# Patient Record
Sex: Male | Born: 1950 | Race: White | Hispanic: No | Marital: Single | Smoking: Never smoker
Health system: Southern US, Community
[De-identification: ages and names within clinical notes are randomized; demographics above are authoritative.]

## PROBLEM LIST (undated history)

## (undated) DIAGNOSIS — I10 Essential (primary) hypertension: Secondary | ICD-10-CM

## (undated) DIAGNOSIS — N400 Enlarged prostate without lower urinary tract symptoms: Secondary | ICD-10-CM

## (undated) DIAGNOSIS — K219 Gastro-esophageal reflux disease without esophagitis: Secondary | ICD-10-CM

## (undated) HISTORY — DX: Benign prostatic hyperplasia without lower urinary tract symptoms: N40.0

## (undated) HISTORY — DX: Gastro-esophageal reflux disease without esophagitis: K21.9

## (undated) HISTORY — PX: TONSILLECTOMY: SUR1361

## (undated) HISTORY — PX: COLONOSCOPY: SHX174

---

## 2009-04-28 ENCOUNTER — Emergency Department: Payer: Self-pay | Admitting: Emergency Medicine

## 2009-07-29 ENCOUNTER — Emergency Department: Payer: Self-pay | Admitting: Emergency Medicine

## 2011-02-19 ENCOUNTER — Emergency Department: Payer: Self-pay | Admitting: Emergency Medicine

## 2012-08-22 DIAGNOSIS — L729 Follicular cyst of the skin and subcutaneous tissue, unspecified: Secondary | ICD-10-CM | POA: Insufficient documentation

## 2013-08-08 ENCOUNTER — Encounter: Payer: Self-pay | Admitting: General Surgery

## 2013-08-21 ENCOUNTER — Encounter: Payer: Self-pay | Admitting: General Surgery

## 2013-08-21 ENCOUNTER — Ambulatory Visit (INDEPENDENT_AMBULATORY_CARE_PROVIDER_SITE_OTHER): Payer: No Typology Code available for payment source | Admitting: General Surgery

## 2013-08-21 VITALS — BP 150/90 | HR 80 | Resp 12 | Ht 67.0 in | Wt 164.0 lb

## 2013-08-21 DIAGNOSIS — L723 Sebaceous cyst: Secondary | ICD-10-CM

## 2013-08-21 DIAGNOSIS — L729 Follicular cyst of the skin and subcutaneous tissue, unspecified: Secondary | ICD-10-CM

## 2013-08-21 NOTE — Patient Instructions (Signed)
Patient to return to have this cyst removed.

## 2013-08-21 NOTE — Progress Notes (Signed)
Patient ID: Aaron [name], male   DOB: 02/14/1953, 62 y.o.   MRN: [id number]  Chief Complaint  Patient presents with  . Other    cyst on neck    HPI Aaron [name] is a 62 y.o. male here today for an evaluation of cyst on neck . Patient states been there for about 1 year. He states it is getting bigger. Sore to touch. He reports that his collar rubs on the area and has been irritating to The patient denies any trauma to the area. No purulent drainage. HPI  Past Medical History  Diagnosis Date  . Enlarged prostate   . GERD (gastroesophageal reflux disease)     Past Surgical History  Procedure Laterality Date  . Tonsillectomy      age 47    No family history on file.  Social History History  Substance Use Topics  . Smoking status: Never Smoker   . Smokeless tobacco: Never Used  . Alcohol Use: No    No Known Allergies  Current Outpatient Prescriptions  Medication Sig Dispense Refill  . aspirin 81 MG tablet Take 81 mg by mouth daily.      Marland Kitchen omeprazole (PRILOSEC) 20 MG capsule Take 20 mg by mouth daily.      . tamsulosin (FLOMAX) 0.4 MG CAPS capsule Take 0.4 mg by mouth daily.       No current facility-administered medications for this visit.    Review of Systems Review of Systems  Constitutional: Negative.   Respiratory: Negative.   Cardiovascular: Negative.     Blood pressure 150/90, pulse 80, resp. rate 12, height 5\' 7"  (1.702 m), weight 164 lb (74.39 kg).  Physical Exam Physical Exam  Constitutional: He is oriented to person, place, and time. He appears well-developed and well-nourished.  Cardiovascular: Normal rate, regular rhythm and normal heart sounds.   Pulmonary/Chest: Breath sounds normal.  Musculoskeletal:  2 by 3 cm tender mass on the back of his head.  Lymphadenopathy:    He has no cervical adenopathy.  Neurological: He is alert and oriented to person, place, and time.  Skin: Skin is warm and dry.    Data Reviewed None.  Assessment     Enlarging sebaceous cyst of the right posterior neck.    Plan    Excision will be schedule convenient time. He was working long force but with the [employer] he has been encouraged to take at least 2 days off afterward to minimize discomfort.       [name] 08/22/2013, 9:42 AM

## 2013-08-22 ENCOUNTER — Encounter: Payer: Self-pay | Admitting: General Surgery

## 2013-09-06 ENCOUNTER — Ambulatory Visit (INDEPENDENT_AMBULATORY_CARE_PROVIDER_SITE_OTHER): Payer: No Typology Code available for payment source | Admitting: General Surgery

## 2013-09-06 ENCOUNTER — Encounter: Payer: Self-pay | Admitting: General Surgery

## 2013-09-06 VITALS — BP 118/80 | HR 84 | Resp 16 | Ht 67.0 in | Wt 184.0 lb

## 2013-09-06 DIAGNOSIS — L723 Sebaceous cyst: Secondary | ICD-10-CM

## 2013-09-06 DIAGNOSIS — L729 Follicular cyst of the skin and subcutaneous tissue, unspecified: Secondary | ICD-10-CM

## 2013-09-06 NOTE — Patient Instructions (Signed)
Patient to return in 1 week for follow up nurse visit.

## 2013-09-06 NOTE — Progress Notes (Signed)
Patient ID: Aaron [name], male   DOB: Apr 10, 1951, 62 y.o.   MRN: [id number]  Chief Complaint  Patient presents with  . Procedure    excision cyst on back of head    HPI Aaron [name] is a 62 y.o. male here today for an excision cyst on back of head.  HPI  Past Medical History  Diagnosis Date  . Enlarged prostate   . GERD (gastroesophageal reflux disease)     Past Surgical History  Procedure Laterality Date  . Tonsillectomy      age 66    No family history on file.  Social History History  Substance Use Topics  . Smoking status: Never Smoker   . Smokeless tobacco: Never Used  . Alcohol Use: No    No Known Allergies  Current Outpatient Prescriptions  Medication Sig Dispense Refill  . aspirin 81 MG tablet Take 81 mg by mouth daily.      Marland Kitchen omeprazole (PRILOSEC) 20 MG capsule Take 20 mg by mouth daily.      . tamsulosin (FLOMAX) 0.4 MG CAPS capsule Take 0.4 mg by mouth daily.       No current facility-administered medications for this visit.    Review of Systems Review of Systems  Constitutional: Negative.   Respiratory: Negative.   Cardiovascular: Negative.     Blood pressure 118/80, pulse 84, resp. rate 16, height 5\' 7"  (1.702 m), weight 184 lb (83.462 kg).  Physical Exam Physical Exam Examination shows a sebaceous cyst in the base the neck just to the right of the midline. This is about 2.5 cm in diameter. Marked decrease in swelling from his last exam.  Assessment    Sebaceous cyst, recently infected.    Plan    We elected to proceed to excision. The area was prepped with alcohol and 20 cc of 0.5% Xylocaine with 0.25% Marcaine with 1 200,000 of epinephrine was utilized well tolerated. ChloraPrep was applied to the skin. Through elliptical incision the cystic area was excised. As this was with 3-0 Vicryl suture ligatures. The deep tissue was approximated with interrupted 3-0 Vicryl sutures. The skin was closed with a running 4-0 nylon suture. Telfa and  Tegaderm dressing applied. The procedure was well tolerated. The patient will follow up in one week for suture removal but the nurse.       Aaron [name] 09/07/2013, 5:14 PM

## 2013-09-07 LAB — PATHOLOGY

## 2013-09-15 ENCOUNTER — Ambulatory Visit (INDEPENDENT_AMBULATORY_CARE_PROVIDER_SITE_OTHER): Payer: Self-pay | Admitting: *Deleted

## 2013-09-15 DIAGNOSIS — L723 Sebaceous cyst: Secondary | ICD-10-CM

## 2013-09-15 DIAGNOSIS — L729 Follicular cyst of the skin and subcutaneous tissue, unspecified: Secondary | ICD-10-CM

## 2013-09-15 NOTE — Progress Notes (Signed)
Patient came in today for a wound check. Sutures removed. The wound is clean, with no signs of infection noted. Follow up as needed.

## 2013-11-18 ENCOUNTER — Emergency Department: Payer: Self-pay | Admitting: Emergency Medicine

## 2013-11-18 LAB — RAPID INFLUENZA A&B ANTIGENS

## 2013-11-18 IMAGING — CR DG CHEST 2V
1 series · 2 of 2 positions shown · non-contrast
Comparison: None.

CLINICAL DATA: Cough, fever, wheezing.

EXAM:
CHEST  2 VIEW

[Series 1: w chest pa · 0.14mm/px · 2 of 2 slices shown]
[im 1/2]
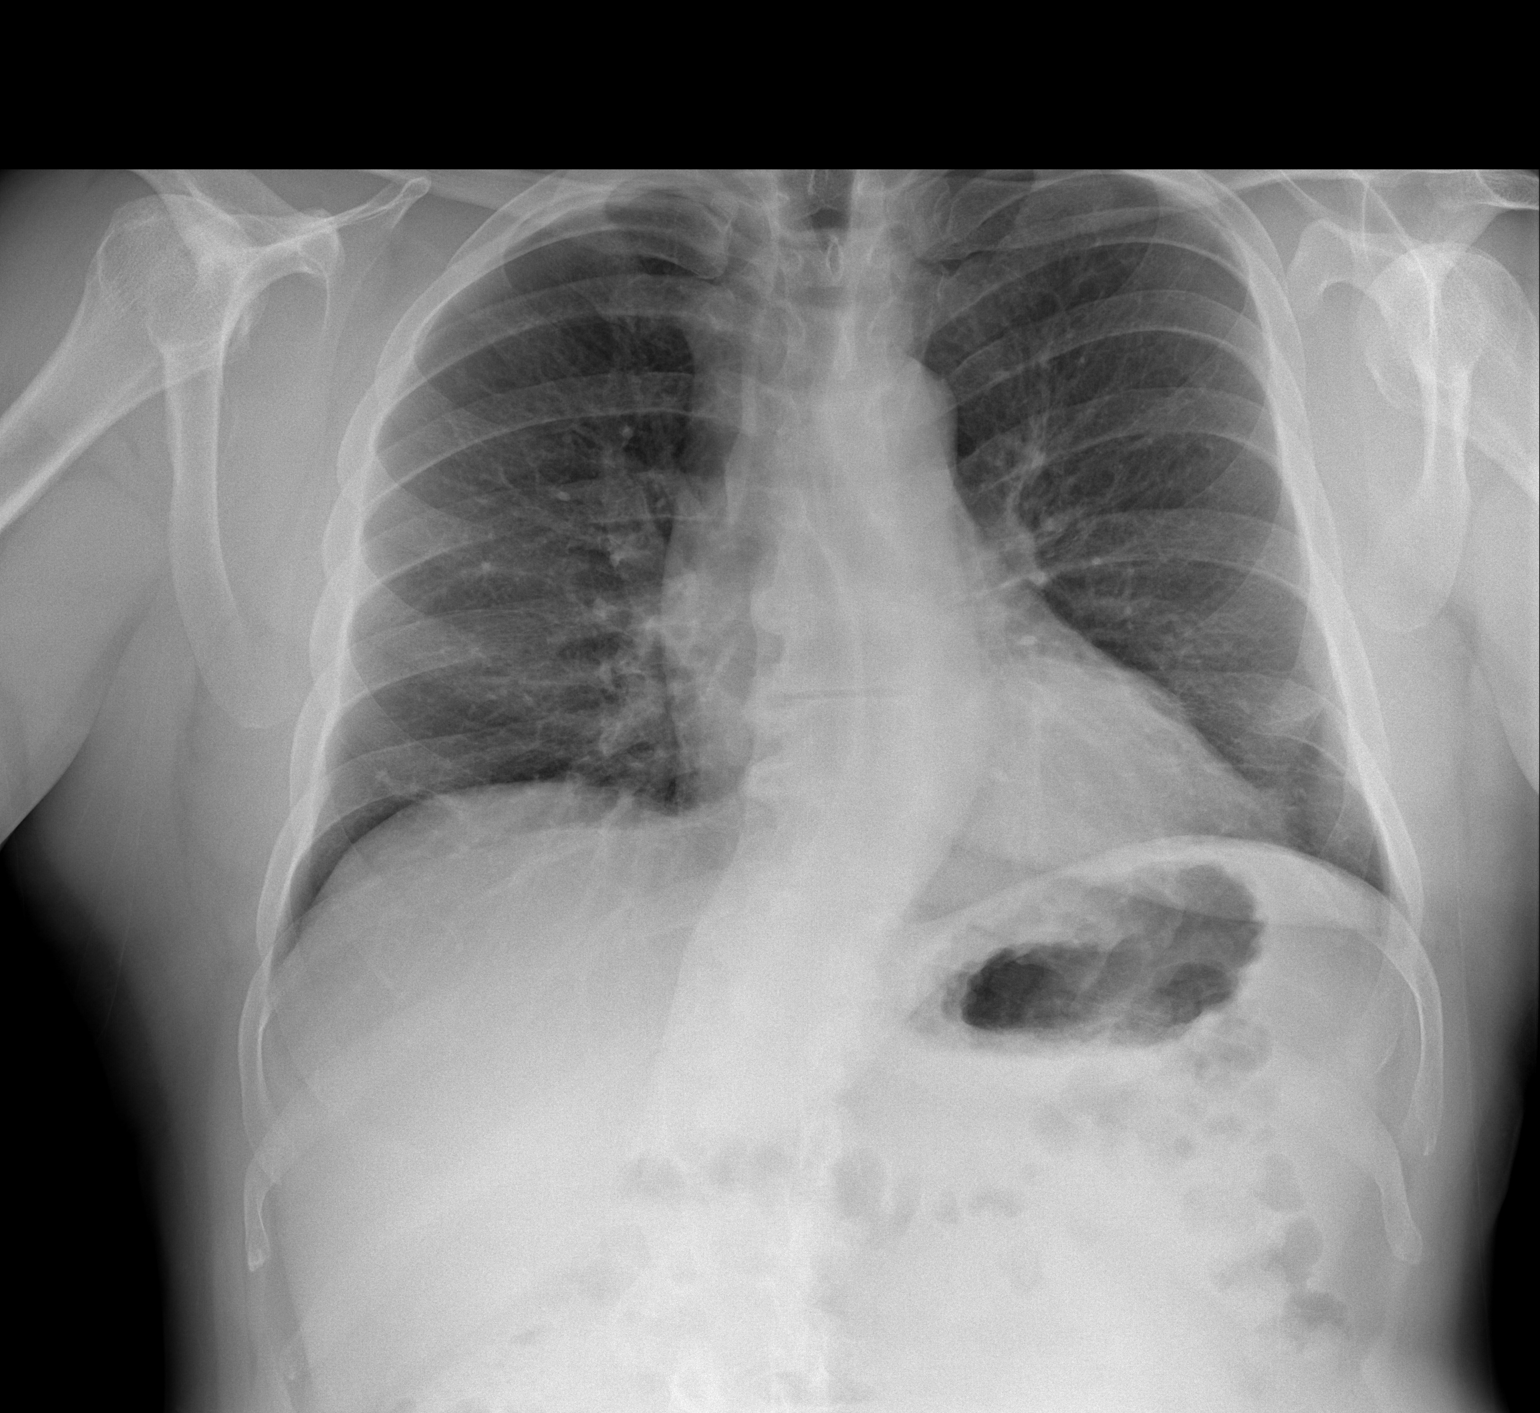
[im 2/2]
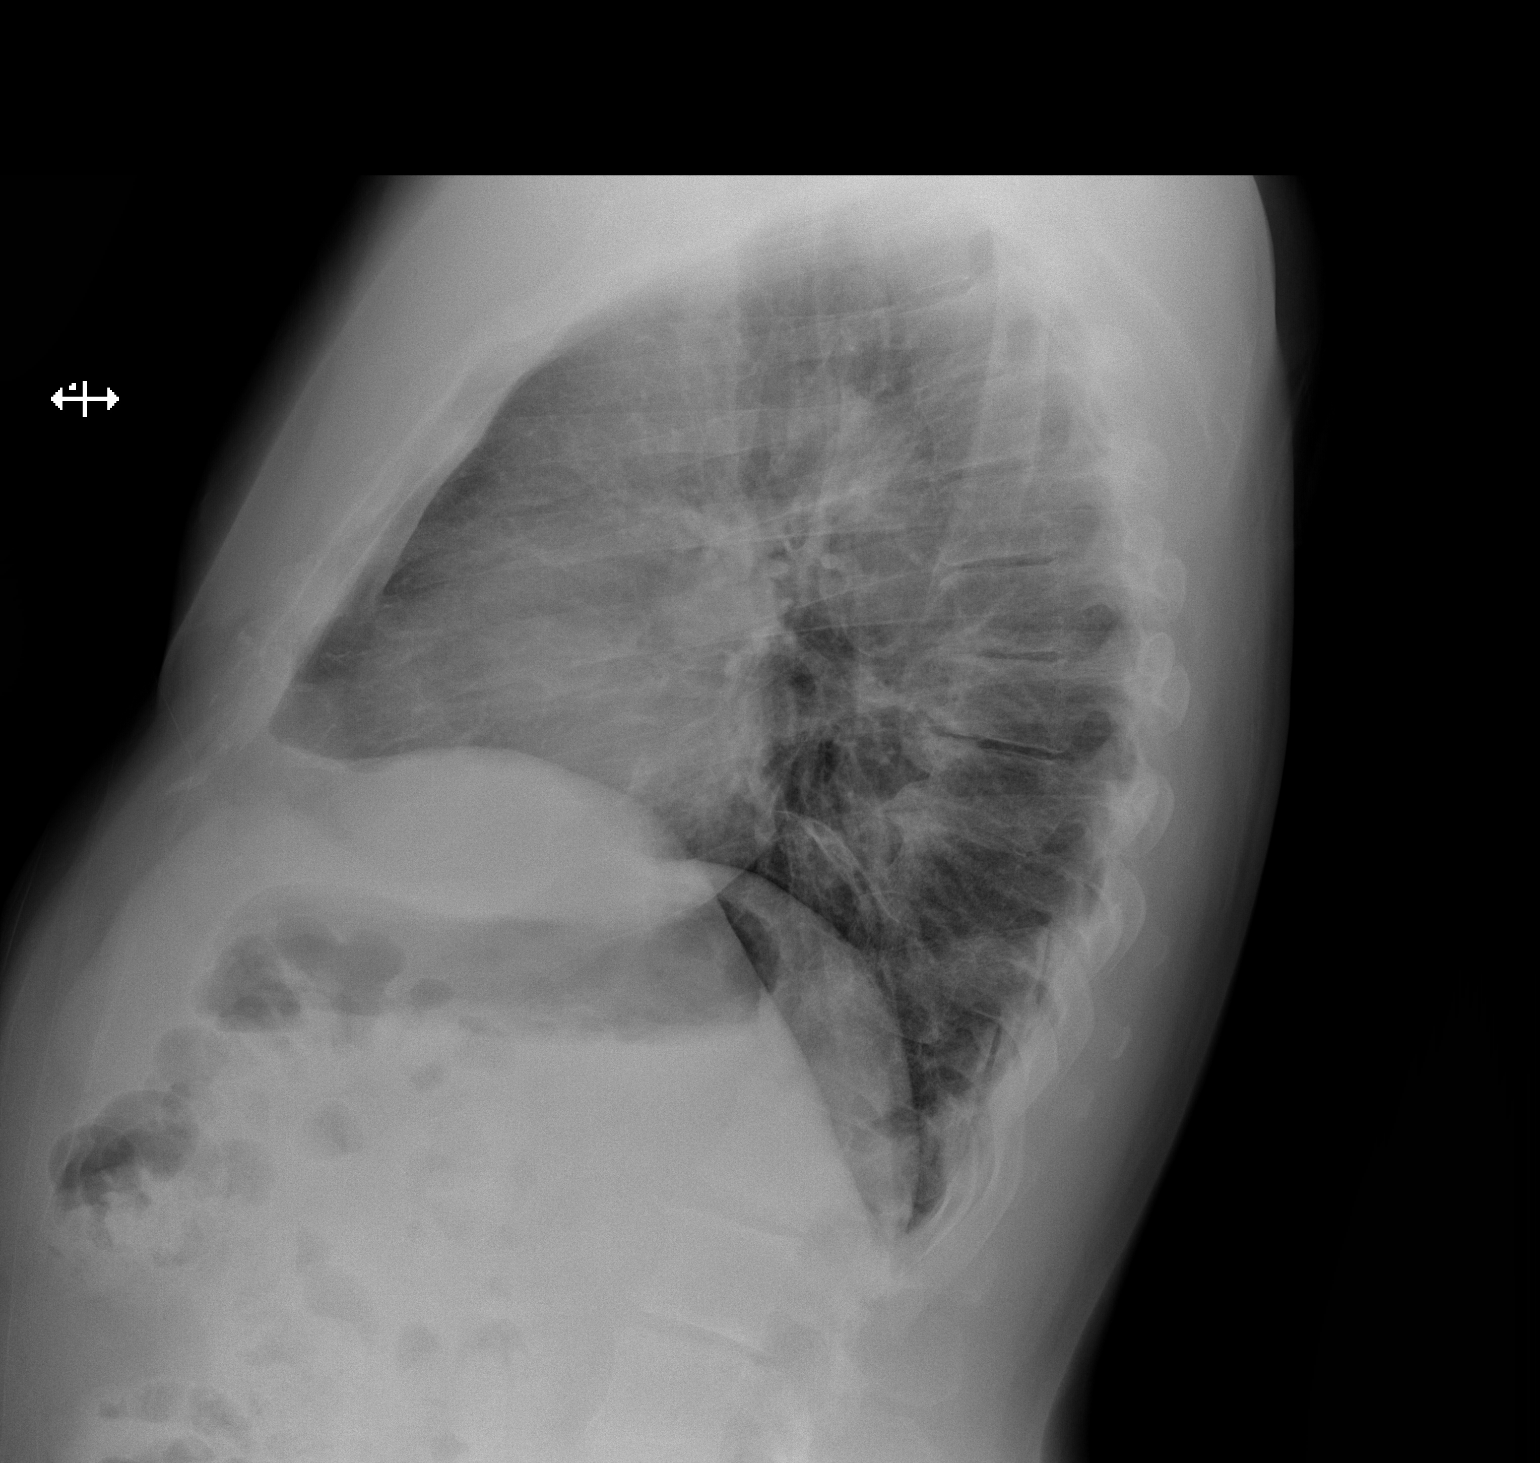

[2 of 2 positions shown; findings below may reference images not displayed]

FINDINGS: Heart is upper limits normal in size. Tortuosity of the thoracic
aorta. No confluent opacities or effusions. Degenerative changes in
the thoracic spine.
IMPRESSION: No acute findings.

## 2014-07-25 ENCOUNTER — Ambulatory Visit: Payer: Self-pay | Admitting: Urology

## 2014-07-31 ENCOUNTER — Ambulatory Visit: Payer: Self-pay | Admitting: Urology

## 2015-03-23 NOTE — H&P (Signed)
PATIENT NAME:  Aaron [name], Aaron [name] MR#:  [id number] DATE OF BIRTH:  October 29, 1951  DATE OF ADMISSION:  07/31/2014  The patient is to have same-day surgery on 07/31/2014.  CHIEF COMPLAINT: Difficulty voiding and elevated PSA.   HISTORY OF PRESENT ILLNESS: Mr. Aaron [name] is [name] 64 year old white male with [name] long history of BPH and lower urinary tract symptoms. He was found to have an elevated PSA of 10.3 and was evaluated with ultrasound-guided biopsy. Ultrasound indicated [name] 97.0 g prostate and pathology was benign on 07/06/2014. He also underwent [name] Uroflow, which was consistent with significant obstruction and [name] residual of 288 mL. The patient was recently diagnosed with hypogonadism, but is not [name] candidate for testosterone replacement therapy due to the massive BPH. He comes in now for photovaporization of the prostate with [name] Green Light laser.   ALLERGIES: NO DRUG ALLERGIES.   CURRENT MEDICATIONS: Prilosec OTC, Flomax, aspirin, protein supplements, amino acid supplements, vitamin E, vitamin B complex, and vitamin B6.   PAST SURGICAL HISTORY: Tonsillectomy in 1957.   SOCIAL HISTORY: The patient denied tobacco use. Consumes 1-4 alcoholic beverages per week.   FAMILY HISTORY: The patient's father died of [name] myocardial infarction at age 17. There is no history of prostate cancer.   PAST AND CURRENT MEDICAL HISTORY:  1.  GERD.  2.  BPH.   REVIEW OF SYSTEMS: The patient denied weight change, fatigue, fever, chills, night sweats, rash, sleep disturbance, double vision, eye pain, loss of vision, difficulty swallowing, chest pain, swelling of the ankles and legs, leg pain, shortness of breath, cough, hematemesis, nausea, vomiting, diarrhea, constipation, hearing loss, stomach pain, hematuria, frequent urinary tract infections, painful urination, nosebleeds, blood in the stool, easy bruising, unwanted growth of hair, hair loss, dizziness, frequent falling and numbness in the arms or legs.   PHYSICAL EXAMINATION:   GENERAL: Well-nourished white male, in no acute distress.  HEENT: Sclerae were clear. Pupils were equally round, reactive to light and accommodation. Extraocular movements were intact.  NECK: Supple. No palpable cervical adenopathy. No audible carotid bruits.  LUNGS: Clear to auscultation.  CARDIOVASCULAR: Regular rhythm and rate, without audible murmurs or gallops.  ABDOMEN: Soft, nontender abdomen.  GENITOURINARY: Circumcised testes, smooth and nontender, 20 mL in size each.  RECTAL: Greater than 50 g, slightly nodular prostate.  NEUROMUSCULAR: Alert, oriented x 3.   IMPRESSION: Massive benign prostatic hypertrophy with bladder outlet obstruction.   PLAN: Photovaporization of the prostate with the Green Light laser.    ____________________________ Otelia Limes. Yves Dill, MD [id number] D: 07/25/2014 12:39:00 ET T: 07/25/2014 13:37:43 ET JOB#: [id number]  cc: Otelia Limes. Yves Dill, MD, <Dictator> Royston Cowper MD ELECTRONICALLY SIGNED 07/25/2014 17:04

## 2015-03-23 NOTE — Op Note (Signed)
PATIENT NAME:  Aaron [name], Aaron [name] MR#:  [id number] DATE OF BIRTH:  1951/03/22  DATE OF PROCEDURE:  07/31/2014  PREOPERATIVE DIAGNOSIS: Benign prostatic hypertrophy with bladder outlet obstruction.   POSTOPERATIVE DIAGNOSIS: Benign prostatic hypertrophy with bladder outlet obstruction.   PROCEDURE:  Photovaporization of the prostate with [name] GreenLight laser.   SURGEON: Maryan Puls, M.D.   ANESTHETIST:  [employer].   ANESTHETIC METHOD: General.   INDICATIONS: See the dictated history and physical. After informed consent, the patient requests the above procedure.   OPERATIVE SUMMARY: After adequate general anesthesia had been obtained, the patient was placed into dorsal lithotomy position and the perineum was prepped and draped in the usual fashion. The laser scope was coupled with [name] camera and then visually advanced into the bladder. The bladder was thoroughly inspected. Both ureteral orifices were identified and had clear efflux. The bladder was moderately trabeculated. No bladder tumors were identified. The patient had lateral lobe prostatic hypertrophy with [name] prostatic urethral length of 6 cm. At this point, the GreenLight XPS laser fiber was introduced through the scope and set at 80 watts. The bladder neck tissue was vaporized. Next, the power was increased up to 120 watts and obstructive tissue from the bladder neck to the verumontanum vaporized. Finally, the power was increased up to 180 watts and remaining obstructive tissue was vaporized. At this point, the scope was removed. Then, 10 mL of viscous Xylocaine was instilled within the urethra. [name] 02-MVVKPQ silicone catheter was placed. The catheter was irrigated until clear. [name] B and O suppository was placed. The procedure was then terminated and the patient was transferred to the recovery room in stable condition.    ____________________________ Otelia Limes. Yves Dill, MD [id number] D: 07/31/2014 17:13:27 ET T: 07/31/2014 17:44:12  ET JOB#: [id number]  cc: Otelia Limes. Yves Dill, MD, <Dictator> Royston Cowper MD ELECTRONICALLY SIGNED 08/01/2014 11:44

## 2015-09-02 ENCOUNTER — Ambulatory Visit: Payer: Self-pay | Admitting: Family

## 2015-09-02 ENCOUNTER — Encounter: Payer: Self-pay | Admitting: Physician Assistant

## 2015-09-02 VITALS — BP 155/99 | HR 96 | Temp 98.0°F

## 2015-09-02 DIAGNOSIS — R7303 Prediabetes: Secondary | ICD-10-CM | POA: Insufficient documentation

## 2015-09-02 DIAGNOSIS — I10 Essential (primary) hypertension: Secondary | ICD-10-CM | POA: Insufficient documentation

## 2015-09-02 MED ORDER — LISINOPRIL 10 MG PO TABS: 10.0000 mg | ORAL_TABLET | Freq: Every day | ORAL | Status: DC

## 2015-09-02 NOTE — Progress Notes (Signed)
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 Lifestyle changes reviewed . F/u 3 weeks.

## 2015-09-18 ENCOUNTER — Encounter: Payer: Self-pay | Admitting: Physician Assistant

## 2015-09-18 ENCOUNTER — Ambulatory Visit: Payer: Self-pay | Admitting: Physician Assistant

## 2015-09-18 VITALS — BP 120/89 | HR 92 | Temp 97.7°F

## 2015-09-18 DIAGNOSIS — S76311A Strain of muscle, fascia and tendon of the posterior muscle group at thigh level, right thigh, initial encounter: Secondary | ICD-10-CM

## 2015-09-18 DIAGNOSIS — I10 Essential (primary) hypertension: Secondary | ICD-10-CM

## 2015-09-18 NOTE — Patient Instructions (Addendum)
[place] RECOMMENDATIONS  NAME:Aaron [name] DEPARTMENT:  SCREEN REPORT: Based on this screening exam, there is or is not a detected condition which would place the examinee, fellow employees, or patients at increased risk of physical impairment from his/her work duties  DIAGNOSIS: torn hamstring  NO RECOMMENDATION/RESTSRICTIONS RECOMMENDATIONS/RESTSRICTIONS LISTED BELOW: no climbing steps for 1 weeks     SIGNATURE:___________________________________________ DATE:____10/19/2016____        Muscle Strain A muscle strain (pulled muscle) happens when a muscle is stretched beyond normal length. It happens when a sudden, violent force stretches your muscle too far. Usually, a few of the fibers in your muscle are torn. Muscle strain is common in athletes. Recovery usually takes 1-2 weeks. Complete healing takes 5-6 weeks.  HOME CARE   Follow the PRICE method of treatment to help your injury get better. Do this the first 2-3 days after the injury:  Protect. Protect the muscle to keep it from getting injured again.  Rest. Limit your activity and rest the injured body part.  Ice. Put ice in a plastic bag. Place a towel between your skin and the bag. Then, apply the ice and leave it on from 15-20 minutes each hour. After the third day, switch to moist heat packs.  Compression. Use a splint or elastic bandage on the injured area for comfort. Do not put it on too tightly.  Elevate. Keep the injured body part above the level of your heart.  Only take medicine as told by your doctor.  Warm up before doing exercise to prevent future muscle strains. GET HELP IF:   You have more pain or puffiness (swelling) in the injured area.  You feel numbness, tingling, or notice a loss of strength in the injured area. MAKE SURE YOU:   Understand these instructions.  Will watch your condition.  Will get help  right away if you are not doing well or get worse.   This information is not intended to replace advice given to you by your health care provider. Make sure you discuss any questions you have with your health care provider.   Document Released: 08/25/2008 Document Revised: 09/06/2013 Document Reviewed: 06/15/2013 Elsevier Interactive Patient Education 2016 Story for Routine Care of Injuries Many injuries can be cared for using rest, ice, compression, and elevation (RICE therapy). Using RICE therapy can help to lessen pain and swelling. It can help your body to heal. Rest Reduce your normal activities and avoid using the injured part of your body. You can go back to your normal activities when you feel okay and your doctor says it is okay. Ice Do not put ice on your bare skin.  Put ice in a plastic bag.  Place a towel between your skin and the bag.  Leave the ice on for 20 minutes, 2-3 times a day. Do this for as long as told by your doctor. Compression Compression means putting pressure on the injured area. This can be done with an elastic bandage. If an elastic bandage has been applied:  Remove and reapply the bandage every 3-4 hours or as told by your doctor.  Make sure the bandage is not wrapped too tight. Wrap the bandage more loosely if part of your body beyond the bandage is blue, swollen, cold, painful, or loses feeling (numb).  See your doctor if the bandage seems to make your problems worse. Elevation Elevation means keeping the injured area raised. Raise the  injured area above your heart or the center of your chest if you can. WHEN SHOULD I GET HELP? You should get help if:  You keep having pain and swelling.  Your symptoms get worse. WHEN SHOULD I GET HELP RIGHT AWAY? You should get help right away if:  You have sudden bad pain at or below the area of your injury.  You have redness or more swelling around your injury.  You have tingling or numbness  at or below the injury that does not go away when you take off the bandage.   This information is not intended to replace advice given to you by your health care provider. Make sure you discuss any questions you have with your health care provider.   Document Released: 05/04/2008 Document Revised: 08/07/2015 Document Reviewed: 10/24/2014 Elsevier Interactive Patient Education [employer].

## 2015-09-18 NOTE — Progress Notes (Signed)
S: has bruising and swelling behind knee, was doing karate kick and felt pop, leg is still a little swollen, increased pain after doing a lot of steps, using otc meds and ice/heat  O: vitals wnl, nad, posterior of r leg with swelling and bruising behind knee and to top of lower leg, area tender at ligament, full rom, n/v intact  A: partial muscle tear  P; otc meds, ice, work note given to not do steps at work for 1 week, return if worsening or call and will refer to sports med

## 2016-01-03 ENCOUNTER — Encounter: Payer: Self-pay | Admitting: Physician Assistant

## 2016-01-03 ENCOUNTER — Ambulatory Visit: Payer: Self-pay | Admitting: Physician Assistant

## 2016-01-03 VITALS — BP 120/80 | HR 94 | Temp 98.4°F

## 2016-01-03 DIAGNOSIS — J209 Acute bronchitis, unspecified: Secondary | ICD-10-CM

## 2016-01-03 MED ORDER — PSEUDOEPH-BROMPHEN-DM 30-2-10 MG/5ML PO SYRP: 5.0000 mL | ORAL_SOLUTION | Freq: Four times a day (QID) | ORAL | Status: DC | PRN

## 2016-01-03 MED ORDER — AZITHROMYCIN 250 MG PO TABS: ORAL_TABLET | ORAL | Status: DC

## 2016-01-03 NOTE — Progress Notes (Signed)
   Subjective:    Patient ID: Aaron [name], male    DOB: 1951/08/29, 65 y.o.   MRN: [id number]  HPI Patient c/o running nose, sore throat, and productive cough for 3 days. Denies fever/chill, or N/V/D. No palliative measures for these compliant.  Review of Systems     Negative except for compliant. Objective:   Physical Exam Vital sign stable, edematous nasal turbinates, post nasal drainage.  Neck supple without adenopathy. Lungs with bilateral Rales. Heart RRR.       Assessment & Plan:Bronchitis  Zithromax and Bromfed DM.  Follow up as needed.

## 2016-01-06 ENCOUNTER — Other Ambulatory Visit: Payer: Self-pay | Admitting: Emergency Medicine

## 2016-01-06 MED ORDER — ZOLPIDEM TARTRATE 10 MG PO TABS: 10.0000 mg | ORAL_TABLET | Freq: Every evening | ORAL | Status: DC | PRN

## 2016-01-06 NOTE — Telephone Encounter (Signed)
Patient came in to request a refill for his Ambien.

## 2016-01-08 ENCOUNTER — Ambulatory Visit: Payer: Self-pay | Admitting: Physician Assistant

## 2016-01-08 ENCOUNTER — Encounter: Payer: Self-pay | Admitting: Physician Assistant

## 2016-01-08 VITALS — BP 130/80 | HR 82 | Temp 97.7°F

## 2016-01-08 DIAGNOSIS — J069 Acute upper respiratory infection, unspecified: Secondary | ICD-10-CM

## 2016-01-08 MED ORDER — METHYLPREDNISOLONE 4 MG PO TBPK: ORAL_TABLET | ORAL | Status: DC

## 2016-01-08 MED ORDER — ALBUTEROL SULFATE HFA 108 (90 BASE) MCG/ACT IN AERS: 2.0000 | INHALATION_SPRAY | Freq: Four times a day (QID) | RESPIRATORY_TRACT | Status: DC | PRN

## 2016-01-08 MED ORDER — BENZONATATE 200 MG PO CAPS: 200.0000 mg | ORAL_CAPSULE | Freq: Two times a day (BID) | ORAL | Status: DC | PRN

## 2016-01-08 NOTE — Progress Notes (Signed)
S: C/o cough and congestion for 7 days, no fever, chills, cp/sob, v/d;  cough is sporadic, hacking, comes in waves, worse with exertion, can't get any mucus out; finished zpack on yesterday  O: PE: vitals wnl, nad,  perrl eomi, normocephalic, tms dull, nasal mucosa red and swollen, throat injected, neck supple no lymph, lungs c t a, cv rrr, neuro intact  A:  Acute viral uri   P: medrol dose pack, albuterol inhaler, tessalon perls; drink fluids, continue regular meds , use otc meds of choice, return if not improving in 5 days, return earlier if worsening

## 2016-02-13 ENCOUNTER — Encounter: Payer: Self-pay | Admitting: Emergency Medicine

## 2016-02-13 DIAGNOSIS — Z7721 Contact with and (suspected) exposure to potentially hazardous body fluids: Secondary | ICD-10-CM | POA: Diagnosis present

## 2016-02-13 DIAGNOSIS — Y9389 Activity, other specified: Secondary | ICD-10-CM | POA: Diagnosis not present

## 2016-02-13 DIAGNOSIS — S0993XA Unspecified injury of face, initial encounter: Secondary | ICD-10-CM | POA: Insufficient documentation

## 2016-02-13 DIAGNOSIS — Z792 Long term (current) use of antibiotics: Secondary | ICD-10-CM | POA: Diagnosis not present

## 2016-02-13 DIAGNOSIS — S50811A Abrasion of right forearm, initial encounter: Secondary | ICD-10-CM | POA: Diagnosis not present

## 2016-02-13 DIAGNOSIS — I10 Essential (primary) hypertension: Secondary | ICD-10-CM | POA: Diagnosis not present

## 2016-02-13 DIAGNOSIS — Y9289 Other specified places as the place of occurrence of the external cause: Secondary | ICD-10-CM | POA: Diagnosis not present

## 2016-02-13 DIAGNOSIS — Z7982 Long term (current) use of aspirin: Secondary | ICD-10-CM | POA: Diagnosis not present

## 2016-02-13 DIAGNOSIS — Y998 Other external cause status: Secondary | ICD-10-CM | POA: Insufficient documentation

## 2016-02-13 DIAGNOSIS — Z79899 Other long term (current) drug therapy: Secondary | ICD-10-CM | POA: Diagnosis not present
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
## 2016-02-14 ENCOUNTER — Emergency Department
Admission: EM | Admit: 2016-02-14 | Discharge: 2016-02-14 | Disposition: A | Discharge status: Home / Self Care | Payer: Worker's Compensation | Attending: Emergency Medicine | Admitting: Emergency Medicine

## 2016-02-14 DIAGNOSIS — Z7721 Contact with and (suspected) exposure to potentially hazardous body fluids: Secondary | ICD-10-CM

## 2016-02-14 NOTE — ED Notes (Signed)
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
## 2016-02-14 NOTE — ED Notes (Signed)
Blood samples obtained by K. Sandrea Matte, RN per protocol.

## 2016-02-14 NOTE — Discharge Instructions (Signed)
Body Fluid Exposure Information  People may come into contact with blood and other body fluids under various circumstances. In some cases, body fluids may contain germs (bacteria or viruses) that cause infections. These germs can be spread when another person's body fluids come into contact with your skin, mouth, eyes, or genitals.   Exposure to body fluids that may contain infectious material is a common problem for people providing care for others who are ill. It can occur when a person is performing health care tasks in the workplace or when taking care of a family member at home. Other common methods of exposure include injection drug use, sharing needles, and sexual activity.  The risk of an infection spreading through body fluid exposure is small and depends on a variety of factors. This includes the type of body fluid, the nature of the exposure, and the health status of the person who was the source of the body fluids. Your health care provider can help you assess the risk.  WHAT TYPES OF BODY FLUID CAN SPREAD INFECTION?  The following types of body fluid have the potential to spread infections:   Blood.   Semen.   Vaginal secretions.   Urine.   Feces.   Saliva.   Nasal or eye discharge.   Breast milk.   Amniotic fluid and fluids surrounding body organs.  WHAT ARE SOME FIRST-AID MEASURES FOR BODY FLUID EXPOSURE?  The following steps should be taken as soon as possible after a person is exposed to body fluids:  Intact Skin   For contact with closed skin, wash the area with soap and water.  Broken Skin   For contact with broken skin (a wound), wash the area with soap and water. Let the area bleed a little. Then place a bandage or clean towel on the wound, applying gentle pressure to stop the bleeding. Do not squeeze or rub the area.   Use just water or hand sanitizer if a sink with soap is not available.   Do not use harsh chemicals such as bleach or iodine.  Eyes   Rinse the eyes with water or  saline for 30 seconds.   If the person is wearing contact lenses, leave the contact lenses in while rinsing the eyes. Once the rinsing is complete, remove the contact lenses.  Mouth   Spit out the fluids. Rinse and spit with water 4-5 times.  In addition, you should remove any clothing that comes into contact with body fluids. However, if body fluid exposure results from sexual assault, seek medical care immediately without changing clothes or bathing.  WHEN SHOULD YOU SEEK HELP?  After performing the proper first-aid steps, you should contact your health care provider or seek emergency care right away if blood or other body fluids made contact with areas of broken skin or openings such as the eyes or mouth. If the exposure to body fluid happened in the workplace, you should report it to your work supervisor immediately. Many workplaces have procedures in place for exposure situations.  WHAT WILL HAPPEN AFTER YOU REPORT THE EXPOSURE?  Your health care provider will ask you several questions. Information requested may include:   Your medical history, including vaccination records.   Date and time of the exposure.   Whether you saw body fluids during the exposure.   Type of body fluid you were exposed to.   Volume of body fluid you were exposed to.   How the exposure happened.   If any devices,   such as a needle, were being used.   Which area of your body made contact with the body fluid.   Description of any injury to the skin or other area.   How long contact was made with the body fluid.   Any information you have about the health status of the person whose body fluid you were exposed to.  The health care provider will assess your risk of infection. Often, no treatment is necessary. In some cases, the health care provider may recommend doing blood tests right away. Follow-up blood tests may also be done at certain intervals during the upcoming weeks and months to check for changes. You may be offered  treatment to prevent an infection from developing after exposure (post-exposure prophylaxis). This may include certain vaccinations or medicines and may be necessary when there is a risk of a serious infection, such as HIV or hepatitis B. Your health care provider should discuss appropriate treatment and vaccinations with you.  HOW CAN YOU PREVENT EXPOSURE AND INFECTION?  Always remember that prevention is the first line of defense against body fluid exposure. To help prevent exposure to body fluids:   Wash and disinfect countertops and other surfaces regularly.   Wear appropriate protective gear such as gloves, gowns, or eyewear when the possibility of exposure is present.   Wipe away spills of body fluid with disposable towels.   Properly dispose of blood products and other fluids. Use secured bags.   Properly dispose of needles and other instruments with sharp points or edges (sharps). Use closed, marked containers.   Avoid injection drug use.   Do not share needles.   Avoid recapping needles.   Use a condom during sexual intercourse.   Make sure you learn and follow any guidelines for preventing exposure (universal precautions) provided at your workplace.  To help reduce your chances of getting an infection:   Make sure your vaccinations are up-to-date, including those for tetanus and hepatitis.   Wash your hands frequently with soap and water. Use hand sanitizers.   Avoid having multiple sex partners.   Follow up with your health care provider as directed after being evaluated for an exposure to body fluids.  To avoid spreading infection to others:   Do not have sexual relations until you know you are free of infection.   Do not donate blood, plasma, breast milk, sperm, or other body fluids.   Do not share hygiene tools such as toothbrushes, razors, or dental floss.   Keep open wounds covered.   Dispose of any items with blood on them (razors, tampons, bandages) by putting them in the  trash.   Do not share drug supplies with others, such as needles, syringes, straws, or pipes.   Follow all of your health care provider's instructions for preventing the spread of infection.     This information is not intended to replace advice given to you by your health care provider. Make sure you discuss any questions you have with your health care provider.     Document Released: 07/19/2013 Document Revised: 11/21/2013 Document Reviewed: 07/19/2013  Elsevier Interactive Patient Education 2016 [employer]vier Inc.

## 2016-02-25 ENCOUNTER — Encounter: Payer: Self-pay | Admitting: Physician Assistant

## 2016-02-25 ENCOUNTER — Ambulatory Visit: Payer: Self-pay | Admitting: Physician Assistant

## 2016-02-25 VITALS — BP 120/70 | HR 83 | Temp 98.5°F

## 2016-02-25 DIAGNOSIS — H6121 Impacted cerumen, right ear: Secondary | ICD-10-CM

## 2016-02-25 NOTE — Progress Notes (Signed)
S: c/o decreased hearing in r ear, no drainage from ear, no pain, sx for 3 days, no other complaints  O: vitals wnl, nad, left tm wnl, r ear canal + cerumen impaction, neck supple no lymph, rma irrigated r ear, unable to remove wax  A: cerumen impaction  P: debrox for 2 days, if wax doesn't come out on its on return to clinic for irrigation

## 2016-06-11 ENCOUNTER — Ambulatory Visit: Payer: Self-pay | Admitting: Physician Assistant

## 2016-06-11 VITALS — BP 160/100 | HR 119 | Temp 97.8°F

## 2016-06-11 DIAGNOSIS — N529 Male erectile dysfunction, unspecified: Secondary | ICD-10-CM

## 2016-06-11 MED ORDER — SILDENAFIL CITRATE 50 MG PO TABS: 50.0000 mg | ORAL_TABLET | Freq: Every day | ORAL | Status: DC | PRN

## 2016-06-11 NOTE — Progress Notes (Signed)
S: here for Rx of viagra that he has taken in the past without any problems. Has not taken B/P meds  Today due to schedule. Also states that he just worked out and came straight here.  Denies any sx. O: Lungs Cl, Heart RRR w/o m A: Erectile dysfunction P: Viagra 50 mg   Recheck b/p today   140/98  P-103      Ambien 10 mg  1 @[facility]   RX given to pt.

## 2016-06-12 ENCOUNTER — Other Ambulatory Visit: Payer: Self-pay | Admitting: Family

## 2016-06-12 DIAGNOSIS — I10 Essential (primary) hypertension: Secondary | ICD-10-CM

## 2016-06-15 ENCOUNTER — Other Ambulatory Visit: Payer: Self-pay | Admitting: Family

## 2016-06-15 DIAGNOSIS — I10 Essential (primary) hypertension: Secondary | ICD-10-CM

## 2016-06-15 MED ORDER — LISINOPRIL 10 MG PO TABS: 10.0000 mg | ORAL_TABLET | Freq: Every day | ORAL | Status: DC

## 2016-06-24 ENCOUNTER — Ambulatory Visit: Payer: Self-pay

## 2016-06-24 DIAGNOSIS — Z299 Encounter for prophylactic measures, unspecified: Secondary | ICD-10-CM

## 2016-06-24 NOTE — Progress Notes (Unsigned)
Patient came in to have blood drawn per Tommie Ann's authorization.

## 2016-06-25 LAB — CMP12+LP+TP+TSH+6AC+PSA+CBC…
A/G RATIO: 2.1 (ref 1.2–2.2)
ALT: 35 IU/L (ref 0–44)
AST: 29 IU/L (ref 0–40)
Albumin: 4.2 g/dL (ref 3.6–4.8)
Alkaline Phosphatase: 69 IU/L (ref 39–117)
BILIRUBIN TOTAL: 0.4 mg/dL (ref 0.0–1.2)
BUN/Creatinine Ratio: 17 (ref 10–24)
BUN: 18 mg/dL (ref 8–27)
Basophils Absolute: 0.1 10*3/uL (ref 0.0–0.2)
Basos: 1 %
Calcium: 9.7 mg/dL (ref 8.6–10.2)
Chloride: 100 mmol/L (ref 96–106)
Chol/HDL Ratio: 4.1 ratio units (ref 0.0–5.0)
Cholesterol, Total: 140 mg/dL (ref 100–199)
Creatinine, Ser: 1.08 mg/dL (ref 0.76–1.27)
EOS (ABSOLUTE): 0.2 10*3/uL (ref 0.0–0.4)
Eos: 3 %
Estimated CHD Risk: 0.8 times avg. (ref 0.0–1.0)
Free Thyroxine Index: 1.7 (ref 1.2–4.9)
GFR calc Af Amer: 84 mL/min/{1.73_m2} (ref >59)
GFR, EST NON AFRICAN AMERICAN: 73 mL/min/{1.73_m2} (ref >59)
GGT: 14 IU/L (ref 0–65)
GLOBULIN, TOTAL: 2 g/dL (ref 1.5–4.5)
Glucose: 83 mg/dL (ref 65–99)
HDL: 34 mg/dL — AB (ref >39)
Hematocrit: 50.5 % (ref 37.5–51.0)
Hemoglobin: 16.8 g/dL (ref 12.6–17.7)
IMMATURE GRANS (ABS): 0 10*3/uL (ref 0.0–0.1)
IRON: 135 ug/dL (ref 38–169)
Immature Granulocytes: 0 %
LDH: 230 IU/L — AB (ref 121–224)
LDL Calculated: 70 mg/dL (ref 0–99)
LYMPHS: 21 %
Lymphocytes Absolute: 1.4 10*3/uL (ref 0.7–3.1)
MCH: 30 pg (ref 26.6–33.0)
MCHC: 33.3 g/dL (ref 31.5–35.7)
MCV: 90 fL (ref 79–97)
MONOS ABS: 0.7 10*3/uL (ref 0.1–0.9)
Monocytes: 11 %
Neutrophils Absolute: 4 10*3/uL (ref 1.4–7.0)
Neutrophils: 64 %
PHOSPHORUS: 2.2 mg/dL — AB (ref 2.5–4.5)
PLATELETS: 205 10*3/uL (ref 150–379)
POTASSIUM: 4.6 mmol/L (ref 3.5–5.2)
PROSTATE SPECIFIC AG, SERUM: 10 ng/mL — AB (ref 0.0–4.0)
RBC: 5.6 x10E6/uL (ref 4.14–5.80)
RDW: 13.5 % (ref 12.3–15.4)
Sodium: 139 mmol/L (ref 134–144)
T3 UPTAKE RATIO: 28 % (ref 24–39)
T4 TOTAL: 6.1 ug/dL (ref 4.5–12.0)
TOTAL PROTEIN: 6.2 g/dL (ref 6.0–8.5)
TRIGLYCERIDES: 180 mg/dL — AB (ref 0–149)
TSH: 1.51 u[IU]/mL (ref 0.450–4.500)
Uric Acid: 5.7 mg/dL (ref 3.7–8.6)
VLDL Cholesterol Cal: 36 mg/dL (ref 5–40)
WBC: 6.4 10*3/uL (ref 3.4–10.8)

## 2016-06-25 LAB — TESTOSTERONE: Testosterone: 405 ng/dL (ref 264–916)

## 2016-06-26 ENCOUNTER — Other Ambulatory Visit: Payer: Self-pay | Admitting: Emergency Medicine

## 2016-06-30 ENCOUNTER — Other Ambulatory Visit: Payer: Self-pay | Admitting: Emergency Medicine

## 2016-07-16 ENCOUNTER — Ambulatory Visit: Payer: Self-pay | Admitting: Physician Assistant

## 2016-07-16 ENCOUNTER — Encounter: Payer: Self-pay | Admitting: Physician Assistant

## 2016-07-16 VITALS — BP 129/85 | HR 110 | Temp 98.1°F

## 2016-07-16 DIAGNOSIS — J069 Acute upper respiratory infection, unspecified: Secondary | ICD-10-CM

## 2016-07-16 MED ORDER — CEFDINIR 300 MG PO CAPS: 300.0000 mg | ORAL_CAPSULE | Freq: Two times a day (BID) | ORAL | 0 refills | Status: DC

## 2016-07-16 MED ORDER — HYDROCOD POLST-CPM POLST ER 10-8 MG/5ML PO SUER: 5.0000 mL | Freq: Two times a day (BID) | ORAL | 0 refills | Status: DC | PRN

## 2016-07-16 NOTE — Progress Notes (Signed)
S: C/o cough and congestion for 3 days, no fever, chills, cp/sob, v/d; mucus was green this am but clear throughout the day, cough is sporadic, feels like its all in his chest  Using otc meds: robitussin  O: PE: perrl eomi, normocephalic, tms dull, nasal mucosa red and swollen, throat injected, neck supple no lymph, lungs c t a, cv rrr, neuro intact, cough is dry  A:  Acute  uri   P: drink fluids, continue regular meds , use otc meds of choice, return if not improving in 5 days, return earlier if worsening , omnicef, tussionex 140ml

## 2016-07-31 ENCOUNTER — Encounter: Payer: Self-pay | Admitting: Physician Assistant

## 2016-07-31 ENCOUNTER — Ambulatory Visit: Payer: Self-pay | Admitting: Physician Assistant

## 2016-07-31 VITALS — BP 120/80 | HR 85 | Temp 97.6°F

## 2016-07-31 DIAGNOSIS — J9801 Acute bronchospasm: Secondary | ICD-10-CM

## 2016-07-31 MED ORDER — METHYLPREDNISOLONE 4 MG PO TBPK: ORAL_TABLET | ORAL | 0 refills | Status: DC

## 2016-07-31 MED ORDER — DEXAMETHASONE SODIUM PHOSPHATE 10 MG/ML IJ SOLN
10.0000 mg | Freq: Once | INTRAMUSCULAR | Status: AC
  Administered 2016-07-31: 10 mg via INTRAMUSCULAR

## 2016-07-31 MED ORDER — FEXOFENADINE HCL 60 MG PO TABS: 60.0000 mg | ORAL_TABLET | Freq: Two times a day (BID) | ORAL | 0 refills | Status: DC

## 2016-07-31 MED ORDER — BENZONATATE 100 MG PO CAPS: 100.0000 mg | ORAL_CAPSULE | Freq: Three times a day (TID) | ORAL | 0 refills | Status: DC | PRN

## 2016-07-31 NOTE — Progress Notes (Signed)
   Subjective:    Patient ID: Aaron [name], male    DOB: 1951-05-22, 65 y.o.   MRN: [id number]  HPI Patient c/o continual non-productive cough s/p treatment for URI consisting of Cefdinir and Tussionex.  States cessation of mediations and cough return. No current palliative measures. Denies sinus congestion or running nose. Denies fever/chill, or N/V/D. Review of medication show patient using Ace Inhibitor.   Review of Systems    HTN and Erectile dysfunction. Objective:   Physical Exam No acute distress. HEENT unremarkable except for copious post nasal drainage and non-productive cough. Neck supple, Lungs CTA, Heart RRR.       Assessment & Plan:Bronchospasams  Medrol Dosepack, Allergra, and Tessalon. Follow up 4 days .

## 2016-08-06 ENCOUNTER — Other Ambulatory Visit: Payer: Self-pay | Admitting: Emergency Medicine

## 2016-08-06 DIAGNOSIS — I10 Essential (primary) hypertension: Secondary | ICD-10-CM

## 2016-08-06 MED ORDER — LISINOPRIL 10 MG PO TABS: 10.0000 mg | ORAL_TABLET | Freq: Every day | ORAL | 6 refills | Status: DC

## 2016-08-06 NOTE — Telephone Encounter (Signed)
Med refill approved 

## 2016-09-22 ENCOUNTER — Ambulatory Visit: Payer: Self-pay | Admitting: Physician Assistant

## 2016-09-22 ENCOUNTER — Encounter: Payer: Self-pay | Admitting: Physician Assistant

## 2016-09-22 VITALS — BP 159/80 | HR 101 | Temp 98.4°F

## 2016-09-22 DIAGNOSIS — Z299 Encounter for prophylactic measures, unspecified: Secondary | ICD-10-CM

## 2016-09-22 NOTE — Progress Notes (Signed)
Patient came in to get his Tdap injection.

## 2016-11-17 ENCOUNTER — Encounter: Payer: Self-pay | Admitting: Physician Assistant

## 2016-11-17 ENCOUNTER — Ambulatory Visit: Payer: Self-pay | Admitting: Physician Assistant

## 2016-11-17 VITALS — BP 130/80 | HR 93 | Temp 97.5°F

## 2016-11-17 DIAGNOSIS — L918 Other hypertrophic disorders of the skin: Secondary | ICD-10-CM

## 2016-11-17 DIAGNOSIS — D229 Melanocytic nevi, unspecified: Secondary | ICD-10-CM

## 2016-11-17 NOTE — Progress Notes (Signed)
   Subjective:skin lesions    Patient ID: Aaron [name], male    DOB: 01/18/1951, 65 y.o.   MRN: [id number]  HPI Patient with multiple skin tags and nevi to upper and mid back. Patient request excision and biopsy of lesion.   Review of Systems Negative except for compliant.    Objective:   Physical Exam Multiple skin tags and nevi to upper and lower back.       Assessment & Plan:Skin tag/nevi  Conuslt to Dermatologist for definitve evaluation.

## 2016-11-18 NOTE — Progress Notes (Signed)
Patient has been referred to [facility] and will see Dallie Dad, PA-C on 12/24/2016 @ 1:30pm.  I called the patient and left the information on his voicemail.

## 2017-01-15 ENCOUNTER — Ambulatory Visit: Payer: Self-pay | Admitting: Physician Assistant

## 2017-02-22 ENCOUNTER — Encounter: Payer: Self-pay | Admitting: Physician Assistant

## 2017-02-22 ENCOUNTER — Ambulatory Visit: Payer: Self-pay | Admitting: Physician Assistant

## 2017-02-22 VITALS — BP 130/80 | HR 77 | Temp 97.5°F

## 2017-02-22 DIAGNOSIS — J069 Acute upper respiratory infection, unspecified: Secondary | ICD-10-CM

## 2017-02-22 MED ORDER — FLUTICASONE PROPIONATE 50 MCG/ACT NA SUSP: 2.0000 | Freq: Every day | NASAL | 6 refills | Status: DC

## 2017-02-22 NOTE — Progress Notes (Signed)
S: C/o runny nose and congestion for 1-2 days, no fever, chills, cp/sob, v/d; mucus is clear throughout the day, cough is sporadic, no otc meds  Using otc meds:   O: PE: vitals wnl, nad, perrl eomi, normocephalic, tms dull, nasal mucosa red and swollen, throat injected, neck supple no lymph, lungs c t a, cv rrr, neuro intact  A:  Acute viral uri   P: drink fluids, continue regular meds , use otc meds of choice, return if not improving in 5 days, return earlier if worsening , flonase, otc saline nasal wash, mucinex

## 2017-04-27 ENCOUNTER — Ambulatory Visit: Payer: Self-pay | Admitting: Physician Assistant

## 2017-04-27 ENCOUNTER — Encounter: Payer: Self-pay | Admitting: Physician Assistant

## 2017-04-27 VITALS — BP 120/85 | HR 90 | Temp 97.5°F

## 2017-04-27 DIAGNOSIS — J069 Acute upper respiratory infection, unspecified: Secondary | ICD-10-CM

## 2017-04-27 MED ORDER — PREDNISONE 10 MG PO TABS: 30.0000 mg | ORAL_TABLET | Freq: Every day | ORAL | 0 refills | Status: DC

## 2017-04-27 MED ORDER — ALBUTEROL SULFATE HFA 108 (90 BASE) MCG/ACT IN AERS: 2.0000 | INHALATION_SPRAY | Freq: Four times a day (QID) | RESPIRATORY_TRACT | 0 refills | Status: DC | PRN

## 2017-04-27 NOTE — Progress Notes (Signed)
S: C/o runny nose and congestion for 3 days, dry cough that makes his chest burn, no fever, chills, cp/sob, v/d; no mucus production but head is stuffy  Using otc meds: robitussin  O: PE: vitals wnl, nad, perrl eomi, normocephalic, tms dull, nasal mucosa red and swollen, throat injected, neck supple no lymph, lungs c t a, cv rrr, neuro intact  A:  Acute viral uri   P: drink fluids, continue regular meds , use otc meds of choice, return if not improving in 5 days, return earlier if worsening , albuterol inhaler, pred 30mg  qd x 3d

## 2017-05-17 ENCOUNTER — Encounter: Payer: Self-pay | Admitting: Physician Assistant

## 2017-05-17 ENCOUNTER — Encounter (INDEPENDENT_AMBULATORY_CARE_PROVIDER_SITE_OTHER): Payer: Self-pay

## 2017-05-17 ENCOUNTER — Ambulatory Visit: Payer: Self-pay | Admitting: Family

## 2017-05-17 VITALS — BP 120/80 | HR 90 | Temp 98.5°F | Resp 16 | Ht 65.0 in | Wt 181.0 lb

## 2017-05-17 DIAGNOSIS — Z0000 Encounter for general adult medical examination without abnormal findings: Secondary | ICD-10-CM

## 2017-05-17 NOTE — Addendum Note (Signed)
Addended by: Rudene Anda T on: 05/17/2017 03:21 PM   Modules accepted: Orders

## 2017-05-17 NOTE — Progress Notes (Signed)
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 No complaints. Medical history hypertension stable on medication family history negative for heart disease. He is up-to-date on his colonoscopy. He has not had an eye exam in 3-4 years. Objective/vital signs stable  ENT upper dental plates, neck supple, heart RSR lungs clear, abdomen soft nontender without masses or megaly, extremities without edema, full range of motion  A/wellness exam  P/biometrics obtained. Follow-up in month of his birthday for annual screening. Encouraged to get  eye exam

## 2017-05-19 LAB — CMP12+LP+TP+TSH+6AC+PSA+CBC…
A/G RATIO: 2 (ref 1.2–2.2)
ALT: 34 IU/L (ref 0–44)
AST: 24 IU/L (ref 0–40)
Albumin: 4.5 g/dL (ref 3.6–4.8)
Alkaline Phosphatase: 63 IU/L (ref 39–117)
BUN / CREAT RATIO: 15 (ref 10–24)
BUN: 17 mg/dL (ref 8–27)
Basophils Absolute: 0 10*3/uL (ref 0.0–0.2)
Basos: 1 %
Bilirubin Total: 0.7 mg/dL (ref 0.0–1.2)
CALCIUM: 9.4 mg/dL (ref 8.6–10.2)
CHLORIDE: 107 mmol/L — AB (ref 96–106)
CHOL/HDL RATIO: 3.5 ratio (ref 0.0–5.0)
Cholesterol, Total: 149 mg/dL (ref 100–199)
Creatinine, Ser: 1.17 mg/dL (ref 0.76–1.27)
EOS (ABSOLUTE): 0.2 10*3/uL (ref 0.0–0.4)
ESTIMATED CHD RISK: 0.5 times avg. (ref 0.0–1.0)
Eos: 4 %
Free Thyroxine Index: 1.7 (ref 1.2–4.9)
GFR, EST AFRICAN AMERICAN: 76 mL/min/{1.73_m2} (ref >59)
GFR, EST NON AFRICAN AMERICAN: 65 mL/min/{1.73_m2} (ref >59)
GGT: 14 IU/L (ref 0–65)
GLUCOSE: 106 mg/dL — AB (ref 65–99)
Globulin, Total: 2.3 g/dL (ref 1.5–4.5)
HDL: 43 mg/dL (ref >39)
HEMATOCRIT: 51.6 % — AB (ref 37.5–51.0)
Hemoglobin: 17.6 g/dL (ref 13.0–17.7)
IMMATURE GRANS (ABS): 0 10*3/uL (ref 0.0–0.1)
Immature Granulocytes: 0 %
Iron: 195 ug/dL — ABNORMAL HIGH (ref 38–169)
LDH: 208 IU/L (ref 121–224)
LDL Calculated: 88 mg/dL (ref 0–99)
LYMPHS: 19 %
Lymphocytes Absolute: 1.1 10*3/uL (ref 0.7–3.1)
MCH: 31.3 pg (ref 26.6–33.0)
MCHC: 34.1 g/dL (ref 31.5–35.7)
MCV: 92 fL (ref 79–97)
MONOCYTES: 11 %
Monocytes Absolute: 0.6 10*3/uL (ref 0.1–0.9)
Neutrophils Absolute: 3.6 10*3/uL (ref 1.4–7.0)
Neutrophils: 65 %
PHOSPHORUS: 2.9 mg/dL (ref 2.5–4.5)
POTASSIUM: 4.8 mmol/L (ref 3.5–5.2)
Platelets: 203 10*3/uL (ref 150–379)
Prostate Specific Ag, Serum: 13.5 ng/mL — ABNORMAL HIGH (ref 0.0–4.0)
RBC: 5.62 x10E6/uL (ref 4.14–5.80)
RDW: 14.2 % (ref 12.3–15.4)
Sodium: 146 mmol/L — ABNORMAL HIGH (ref 134–144)
T3 Uptake Ratio: 27 % (ref 24–39)
T4, Total: 6.3 ug/dL (ref 4.5–12.0)
TSH: 1.09 u[IU]/mL (ref 0.450–4.500)
Total Protein: 6.8 g/dL (ref 6.0–8.5)
Triglycerides: 88 mg/dL (ref 0–149)
URIC ACID: 5.1 mg/dL (ref 3.7–8.6)
VLDL Cholesterol Cal: 18 mg/dL (ref 5–40)
WBC: 5.6 10*3/uL (ref 3.4–10.8)

## 2017-05-19 LAB — HIV ANTIBODY (ROUTINE TESTING W REFLEX): HIV Screen 4th Generation wRfx: NONREACTIVE

## 2017-05-19 LAB — HCV COMMENT:

## 2017-05-19 LAB — VITAMIN D 25 HYDROXY (VIT D DEFICIENCY, FRACTURES): VIT D 25 HYDROXY: 23.9 ng/mL — AB (ref 30.0–100.0)

## 2017-05-19 LAB — HEPATITIS C ANTIBODY (REFLEX): HCV Ab: <0.1 s/co ratio (ref 0.0–0.9)

## 2017-07-22 ENCOUNTER — Other Ambulatory Visit: Payer: Self-pay | Admitting: Physician Assistant

## 2017-07-22 NOTE — Telephone Encounter (Signed)
Med refill for viagra approved

## 2017-08-18 ENCOUNTER — Ambulatory Visit: Payer: Self-pay | Admitting: Emergency Medicine

## 2017-08-18 ENCOUNTER — Encounter: Payer: Self-pay | Admitting: Emergency Medicine

## 2017-08-18 DIAGNOSIS — Z299 Encounter for prophylactic measures, unspecified: Secondary | ICD-10-CM

## 2017-08-18 NOTE — Progress Notes (Signed)
Patient came in to have blood drawn for testing per Susan's authorization. 

## 2017-08-19 LAB — COMPREHENSIVE METABOLIC PANEL
A/G RATIO: 2.1 (ref 1.2–2.2)
ALBUMIN: 4.4 g/dL (ref 3.6–4.8)
ALK PHOS: 58 IU/L (ref 39–117)
ALT: 36 IU/L (ref 0–44)
AST: 32 IU/L (ref 0–40)
BUN / CREAT RATIO: 12 (ref 10–24)
BUN: 15 mg/dL (ref 8–27)
Bilirubin Total: 0.3 mg/dL (ref 0.0–1.2)
CALCIUM: 8.9 mg/dL (ref 8.6–10.2)
CO2: 19 mmol/L — ABNORMAL LOW (ref 20–29)
Chloride: 101 mmol/L (ref 96–106)
Creatinine, Ser: 1.21 mg/dL (ref 0.76–1.27)
GFR calc Af Amer: 73 mL/min/{1.73_m2} (ref >59)
GFR, EST NON AFRICAN AMERICAN: 63 mL/min/{1.73_m2} (ref >59)
GLOBULIN, TOTAL: 2.1 g/dL (ref 1.5–4.5)
Glucose: 86 mg/dL (ref 65–99)
Potassium: 4.2 mmol/L (ref 3.5–5.2)
SODIUM: 139 mmol/L (ref 134–144)
Total Protein: 6.5 g/dL (ref 6.0–8.5)

## 2017-08-19 LAB — HGB A1C W/O EAG: Hgb A1c MFr Bld: 5.6 % (ref 4.8–5.6)

## 2017-08-21 LAB — IRON: IRON: 41 ug/dL (ref 38–169)

## 2017-08-21 LAB — SPECIMEN STATUS REPORT

## 2017-10-05 ENCOUNTER — Ambulatory Visit: Payer: Self-pay | Admitting: Physician Assistant

## 2017-10-05 ENCOUNTER — Encounter: Payer: Self-pay | Admitting: Physician Assistant

## 2017-10-05 VITALS — BP 120/80 | HR 85 | Temp 98.5°F | Resp 16

## 2017-10-05 DIAGNOSIS — H6123 Impacted cerumen, bilateral: Secondary | ICD-10-CM

## 2017-10-05 DIAGNOSIS — J0100 Acute maxillary sinusitis, unspecified: Secondary | ICD-10-CM

## 2017-10-05 MED ORDER — AMOXICILLIN 875 MG PO TABS: 875.0000 mg | ORAL_TABLET | Freq: Two times a day (BID) | ORAL | 0 refills | Status: DC

## 2017-10-05 MED ORDER — PREDNISONE 10 MG PO TABS: 30.0000 mg | ORAL_TABLET | Freq: Every day | ORAL | 0 refills | Status: DC

## 2017-10-05 NOTE — Progress Notes (Signed)
S: Patient states he can't hear, both of his ears are stopped up, also states he has sinus congestion and pressure, sore throat, dry cough. Denies fever chills, chest pain, or shortness of breath. Use over-the-counter eardrops without relief. Did not use any sinus medications  O: Vitals WNL, both ears have cerumen impactions, nasal mucosa is red and swollen, throat is irritated, neck is supple, no lymphadenopathy, lungs are clear, CV  RRR Irrigated both ears with warm water, wax was removed from both ear canals, TMs are clear, patient tolerated procedure well  A: Acute sinusitis, bilateral cerumen impaction  P: Amoxicillin 875 mg twice a day for 10 days, prednisone 30 mg per day for 3 days. If hearing does not return to normal within a week we'll refer ear nose and throat for evaluation of his hearing

## 2017-10-26 ENCOUNTER — Other Ambulatory Visit: Payer: Self-pay | Admitting: Physician Assistant

## 2017-10-26 NOTE — Telephone Encounter (Signed)
Attached is a med refill request.

## 2017-10-27 ENCOUNTER — Ambulatory Visit: Payer: Self-pay | Admitting: Emergency Medicine

## 2017-10-27 VITALS — BP 140/80 | HR 78 | Temp 98.2°F | Resp 16

## 2017-10-27 DIAGNOSIS — J069 Acute upper respiratory infection, unspecified: Secondary | ICD-10-CM

## 2017-10-27 MED ORDER — PSEUDOEPH-BROMPHEN-DM 30-2-10 MG/5ML PO SYRP: 5.0000 mL | ORAL_SOLUTION | Freq: Four times a day (QID) | ORAL | 0 refills | Status: DC | PRN

## 2017-10-27 NOTE — Progress Notes (Signed)
S: Started yesterday with cough and congestion.  No fever.  No OTC meds used O: TMS dull bilat with poor light reflex.  Nose boggy.  Throat with post drainage. Neck supple without aden. Lungs Clear  Heart RRR  A:  Viral URI P: bromfed DM  Qid prn

## 2017-12-09 ENCOUNTER — Ambulatory Visit: Payer: Self-pay | Admitting: Medical

## 2017-12-09 VITALS — BP 139/89 | HR 76 | Temp 97.6°F | Resp 16

## 2017-12-09 DIAGNOSIS — R05 Cough: Secondary | ICD-10-CM

## 2017-12-09 DIAGNOSIS — R059 Cough, unspecified: Secondary | ICD-10-CM

## 2017-12-09 DIAGNOSIS — J069 Acute upper respiratory infection, unspecified: Secondary | ICD-10-CM

## 2017-12-09 MED ORDER — AZITHROMYCIN 250 MG PO TABS: ORAL_TABLET | ORAL | 0 refills | Status: DC

## 2017-12-09 MED ORDER — BENZONATATE 100 MG PO CAPS: 100.0000 mg | ORAL_CAPSULE | Freq: Three times a day (TID) | ORAL | 0 refills | Status: DC

## 2017-12-09 NOTE — Patient Instructions (Signed)
Cough, Adult A cough helps to clear your throat and lungs. A cough may last only 2-3 weeks (acute), or it may last longer than 8 weeks (chronic). Many different things can cause a cough. A cough may be a sign of an illness or another medical condition. Follow these instructions at home:  Pay attention to any changes in your cough.  Take medicines only as told by your doctor. ? If you were prescribed an antibiotic medicine, take it as told by your doctor. Do not stop taking it even if you start to feel better. ? Talk with your doctor before you try using a cough medicine.  Drink enough fluid to keep your pee (urine) clear or pale yellow.  If the air is dry, use a cold steam vaporizer or humidifier in your home.  Stay away from things that make you cough at work or at home.  If your cough is worse at night, try using extra pillows to raise your head up higher while you sleep.  Do not smoke, and try not to be around smoke. If you need help quitting, ask your doctor.  Do not have caffeine.  Do not drink alcohol.  Rest as needed. Contact a doctor if:  You have new problems (symptoms).  You cough up yellow fluid (pus).  Your cough does not get better after 2-3 weeks, or your cough gets worse.  Medicine does not help your cough and you are not sleeping well.  You have pain that gets worse or pain that is not helped with medicine.  You have a fever.  You are losing weight and you do not know why.  You have night sweats. Get help right away if:  You cough up blood.  You have trouble breathing.  Your heartbeat is very fast. This information is not intended to replace advice given to you by your health care provider. Make sure you discuss any questions you have with your health care provider. Document Released: 07/30/2011 Document Revised: 04/23/2016 Document Reviewed: 01/23/2015 Elsevier Interactive Patient Education  2018 Elsevier Inc. Upper Respiratory Infection,  Adult Most upper respiratory infections (URIs) are caused by a virus. A URI affects the nose, throat, and upper air passages. The most common type of URI is often called "the common cold." Follow these instructions at home:  Take medicines only as told by your doctor.  Gargle warm saltwater or take cough drops to comfort your throat as told by your doctor.  Use a warm mist humidifier or inhale steam from a shower to increase air moisture. This may make it easier to breathe.  Drink enough fluid to keep your pee (urine) clear or pale yellow.  Eat soups and other clear broths.  Have a healthy diet.  Rest as needed.  Go back to work when your fever is gone or your doctor says it is okay. ? You may need to stay home longer to avoid giving your URI to others. ? You can also wear a face mask and wash your hands often to prevent spread of the virus.  Use your inhaler more if you have asthma.  Do not use any tobacco products, including cigarettes, chewing tobacco, or electronic cigarettes. If you need help quitting, ask your doctor. Contact a doctor if:  You are getting worse, not better.  Your symptoms are not helped by medicine.  You have chills.  You are getting more short of breath.  You have brown or red mucus.  You have yellow or   brown discharge from your nose.  You have pain in your face, especially when you bend forward.  You have a fever.  You have puffy (swollen) neck glands.  You have pain while swallowing.  You have white areas in the back of your throat. Get help right away if:  You have very bad or constant: ? Headache. ? Ear pain. ? Pain in your forehead, behind your eyes, and over your cheekbones (sinus pain). ? Chest pain.  You have long-lasting (chronic) lung disease and any of the following: ? Wheezing. ? Long-lasting cough. ? Coughing up blood. ? A change in your usual mucus.  You have a stiff neck.  You have changes in  your: ? Vision. ? Hearing. ? Thinking. ? Mood. This information is not intended to replace advice given to you by your health care provider. Make sure you discuss any questions you have with your health care provider. Document Released: 05/04/2008 Document Revised: 07/19/2016 Document Reviewed: 02/21/2014 Elsevier Interactive Patient Education  2018 Elsevier Inc.  

## 2017-12-09 NOTE — Progress Notes (Signed)
   Subjective:    Patient ID: Aaron [name], male    DOB: 06-11-1951, 67 y.o.   MRN: [id number]  HPI  67 yo male in non acute distress, with cough with upper chest congestion, nasal congestion and sore throat and post nasal drip drainage x 3-4 days.  Blood pressure 139/89, pulse 76, temperature 97.6 F (36.4 C), temperature source Oral, resp. rate 16, SpO2 98 %.  Review of Systems  Constitutional: Negative for chills and fever.  HENT: Positive for congestion, postnasal drip and sore throat. Negative for ear pain.   Eyes: Negative for discharge and itching.  Respiratory: Positive for cough and chest tightness (upper chest). Negative for shortness of breath and wheezing.   Cardiovascular: Negative for chest pain and leg swelling.  Musculoskeletal: Negative for myalgias.  Skin: Negative for rash.       Objective:   Physical Exam  Constitutional: He is oriented to person, place, and time. He appears well-developed and well-nourished.  HENT:  Head: Normocephalic and atraumatic.  Right Ear: Hearing, external ear and ear canal normal. A middle ear effusion is present.  Left Ear: Hearing, external ear and ear canal normal. A middle ear effusion is present.  Nose: Mucosal edema and rhinorrhea present.  Mouth/Throat: Uvula is midline. Posterior oropharyngeal erythema present. No oropharyngeal exudate or posterior oropharyngeal edema.  Cardiovascular: Normal rate, regular rhythm and normal heart sounds. Exam reveals no gallop and no friction rub.  No murmur heard. Pulmonary/Chest: Effort normal and breath sounds normal. No respiratory distress. He has no wheezes. He has no rales.  Neurological: He is alert and oriented to person, place, and time.  Skin: Skin is warm and dry.  Psychiatric: He has a normal mood and affect. His behavior is normal. Judgment and thought content normal.  Nursing note and vitals reviewed.         Assessment & Plan:  Upper Respiratory Infection/Sinusitis/  Cough Meds ordered this encounter  Medications  . azithromycin (ZITHROMAX) 250 MG tablet    Sig: Take  2 tablets by mouth today then one tablet days 2-5 , take with food    Dispense:  6 tablet    Refill:  0  . benzonatate (TESSALON) 100 MG capsule    Sig: Take 1 capsule (100 mg total) by mouth 3 (three) times daily.    Dispense:  30 capsule    Refill:  0    Return to clinic in  3-5 days if not improving. Patient verbalizes understanding and has no questions at discharge.

## 2017-12-13 ENCOUNTER — Encounter: Payer: Self-pay | Admitting: Physician Assistant

## 2017-12-13 ENCOUNTER — Ambulatory Visit: Payer: Self-pay | Admitting: Physician Assistant

## 2017-12-13 VITALS — BP 130/96 | HR 79 | Temp 97.8°F

## 2017-12-13 DIAGNOSIS — J0120 Acute ethmoidal sinusitis, unspecified: Secondary | ICD-10-CM

## 2017-12-13 MED ORDER — FEXOFENADINE-PSEUDOEPHED ER 60-120 MG PO TB12: 1.0000 | ORAL_TABLET | Freq: Two times a day (BID) | ORAL | 0 refills | Status: DC

## 2017-12-13 MED ORDER — AMOXICILLIN 875 MG PO TABS: 875.0000 mg | ORAL_TABLET | Freq: Two times a day (BID) | ORAL | 0 refills | Status: DC

## 2017-12-13 NOTE — Progress Notes (Signed)
   Subjective: Sinus congestion     Patient ID: Aaron [name], male    DOB: 1951/01/05, 67 y.o.   MRN: [id number]  HPI Patient's return for reevaluation of sinus congestion. Patient stated he was seen last week and given his CPAP which has not helped his sinus congestion. Patient states nasal discharge has changed from yellowish to greenish in consistency. Patient state that  has sore throat but it has resolved. Patient denies fever associated this complaint. Patient states coughing today secondary to postnasal drainage. Patient was also was prescribed Tessalon Perles but was not coughing until today.   Review of Systems     Objective:   Physical Exam HEENT is remarkable for bilateral maxillary guarding with edematous nasal turbinates. Postnasal drainage. Neck is supple without adenopathy. Lungs are clear to auscultation heart regular rate and rhythm.       Assessment & Plan: Sinusitis   Patient given discharge care instructions. Patient given prescription for amoxicillin and Allegra-D. Patient was take medications directed to follow-up if no improvement.

## 2017-12-15 ENCOUNTER — Other Ambulatory Visit: Payer: Self-pay | Admitting: Physician Assistant

## 2018-01-14 ENCOUNTER — Encounter: Payer: Self-pay | Admitting: Physician Assistant

## 2018-01-14 ENCOUNTER — Ambulatory Visit: Payer: Self-pay | Admitting: Physician Assistant

## 2018-01-14 VITALS — BP 120/88 | HR 91 | Temp 98.3°F

## 2018-01-14 DIAGNOSIS — J0120 Acute ethmoidal sinusitis, unspecified: Secondary | ICD-10-CM

## 2018-01-14 MED ORDER — FEXOFENADINE-PSEUDOEPHED ER 60-120 MG PO TB12: 1.0000 | ORAL_TABLET | Freq: Two times a day (BID) | ORAL | 0 refills | Status: DC

## 2018-01-14 MED ORDER — SULFAMETHOXAZOLE-TRIMETHOPRIM 800-160 MG PO TABS
1.0000 | ORAL_TABLET | Freq: Two times a day (BID) | ORAL | 0 refills | Status: DC
Start: 2018-01-14 — End: 2018-03-08

## 2018-01-14 MED ORDER — FLUTICASONE PROPIONATE 50 MCG/ACT NA SUSP: 2.0000 | Freq: Every day | NASAL | 6 refills | Status: DC

## 2018-01-14 MED ORDER — BENZONATATE 200 MG PO CAPS: 200.0000 mg | ORAL_CAPSULE | Freq: Two times a day (BID) | ORAL | 0 refills | Status: DC | PRN

## 2018-01-14 NOTE — Progress Notes (Signed)
   Subjective: Sinus congestion    Patient ID: Aaron [name], male    DOB: 1951-07-21, 67 y.o.   MRN: [id number]  HPI Patient complained of 7-10 days of sinus congestion.  Patient also complained of ear pressure and facial pain.  Patient state postnasal drainage which is causing a cough.  Patient denies fever or chills associated with this complaint.  No palates measured for complaint.   Review of Systems Negative except for complaint    Objective:   Physical Exam HEENT remarkable for bilateral maxillary guarding with edematous nasal turbinates.  There is postnasal drainage.  Neck is supple without adenopathy.  Lungs are clear to auscultation with a nonproductive cough.  Heart is regular rate and rhythm.       Assessment & Plan: Sinusitis  Patient given a prescription for Bactrim DS, Allegra-D, Tessalon Perles, and Flonase.  Patient advised follow-up PCP.

## 2018-03-08 ENCOUNTER — Ambulatory Visit: Payer: Self-pay | Admitting: Family Medicine

## 2018-03-08 ENCOUNTER — Encounter: Payer: Self-pay | Admitting: Family Medicine

## 2018-03-08 VITALS — BP 138/86 | HR 108 | Temp 98.3°F | Resp 18

## 2018-03-08 DIAGNOSIS — J329 Chronic sinusitis, unspecified: Secondary | ICD-10-CM

## 2018-03-08 DIAGNOSIS — R52 Pain, unspecified: Secondary | ICD-10-CM

## 2018-03-08 LAB — POCT INFLUENZA A/B
INFLUENZA B, POC: NEGATIVE
Influenza A, POC: NEGATIVE

## 2018-03-08 NOTE — Progress Notes (Signed)
Subjective: congestion     Aaron [name] is a 67 y.o. male who presents for evaluation of nasal congestion, body aches, nonproductive cough, fatigue, and headache for 3 days.  Patient reports that his significant other was diagnosed with influenza B yesterday.  Patient reports that he has had similar symptoms and both have been without a fever.  Patient reports that initially his predominant symptom was cough with some chest tightness this weekend, which has been improving and shifted to his predominant symptom being nasal congestion.  Reports resolution of chest tightness and denies shortness of breath.  Patient reports being treated monthly recently for recurrent sinus infections.  Patient was advised to take Flonase daily, which he is compliant with.  Patient denies any history of allergies. Treatment to date: NyQuil.  Denies rash, nausea, vomiting, diarrhea, shortness of breath, wheezing, chest/back pain, ear pain, sore throat, difficulty swallowing, confusion, AMS, fever, chills, severe symptoms, or initial improvement and then worsening of symptoms.. History of smoking, asthma, COPD: Negative History of recurrent sinus and/or lung infections: Positive for recurrent sinus infections.  Negative for any previous lung infections. Has never seen ENT and has never been referred. Medical history: Hypertension only according to patient.  Reports only medication that he is currently taking his Flonase and lisinopril. Antibiotic use in the last 3 months: Positive for multiple antibiotics in the last 3 months.   Review of Systems Pertinent items noted in HPI and remainder of comprehensive ROS otherwise negative.     Objective:   Physical Exam General: Awake, alert, and oriented. No acute distress. Well developed, hydrated and nourished. Appears stated age. Nontoxic appearance.  HEENT:  PND noted.  No erythema to posterior oropharynx.  No edema or exudates of pharynx or tonsils. No erythema or bulging of  TM.  Mild erythema/edema to nasal mucosa. Sinuses nontender. Supple neck without adenopathy. Cardiac: Heart rate elevated at 108.  Heart rhythm normal. No murmurs, gallops, or rubs are auscultated. S1 and S2 are heard and are of normal intensity.  Respiratory: No signs of respiratory distress. Lungs clear. No tachypnea. Able to speak in full sentences without dyspnea. Nonlabored respirations.  Skin: Skin is warm, dry and intact. Appropriate color for ethnicity. No cyanosis noted.   Diagnostic Results: Flu testing negative.  Assessment:    Flu-like illness   Plan:    Discussed diagnosis and treatment of URI.  Discussed the importance of avoiding unnecessary antibiotic therapy. Suggested symptomatic OTC remedies. Nasal saline spray for congestion.   Advised the patient to return to the clinic tomorrow for reassessment. Referral placed to ENT for recurrent sinusitis. Discussed red flag symptoms and circumstances with which to seek medical care.

## 2018-03-09 ENCOUNTER — Ambulatory Visit
Admission: RE | Admit: 2018-03-09 | Discharge: 2018-03-09 | Disposition: A | Discharge status: Home / Self Care | Payer: Managed Care, Other (non HMO) | Source: Ambulatory Visit | Attending: Family Medicine | Admitting: Family Medicine

## 2018-03-09 ENCOUNTER — Ambulatory Visit: Payer: Self-pay | Admitting: Family Medicine

## 2018-03-09 VITALS — BP 148/88 | HR 112 | Temp 98.3°F | Resp 20

## 2018-03-09 DIAGNOSIS — R05 Cough: Secondary | ICD-10-CM | POA: Diagnosis present

## 2018-03-09 DIAGNOSIS — R062 Wheezing: Secondary | ICD-10-CM | POA: Insufficient documentation

## 2018-03-09 DIAGNOSIS — J209 Acute bronchitis, unspecified: Secondary | ICD-10-CM

## 2018-03-09 DIAGNOSIS — R000 Tachycardia, unspecified: Secondary | ICD-10-CM | POA: Insufficient documentation

## 2018-03-09 DIAGNOSIS — R059 Cough, unspecified: Secondary | ICD-10-CM

## 2018-03-09 IMAGING — CR DG CHEST 2V
1 series · 2 of 2 positions shown · non-contrast
Comparison: [DATE]

CLINICAL DATA: Tachycardia with wheezing

EXAM:
CHEST - 2 VIEW

[Series 1: dg chest 2 view · 0.14mm/px · 2 of 2 slices shown]
[im 1/2]
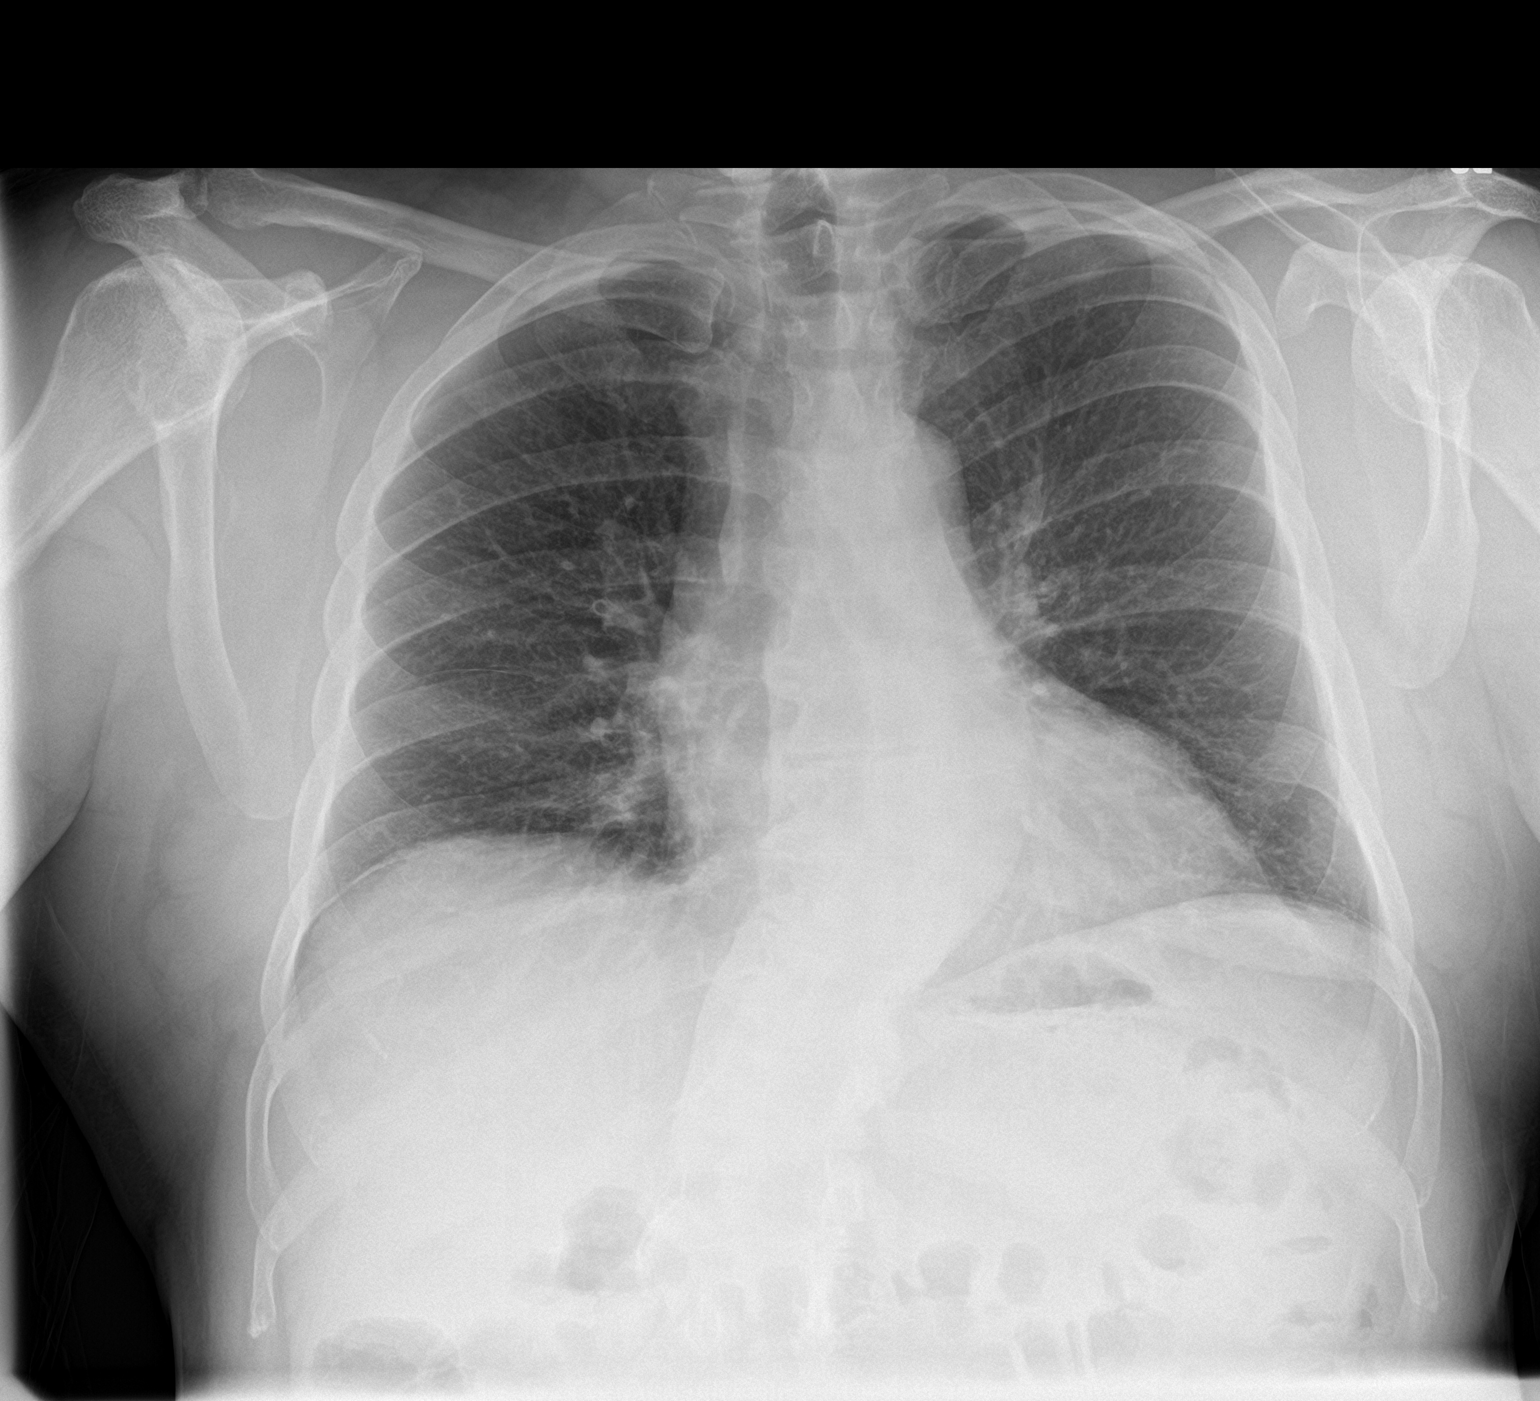
[im 2/2]
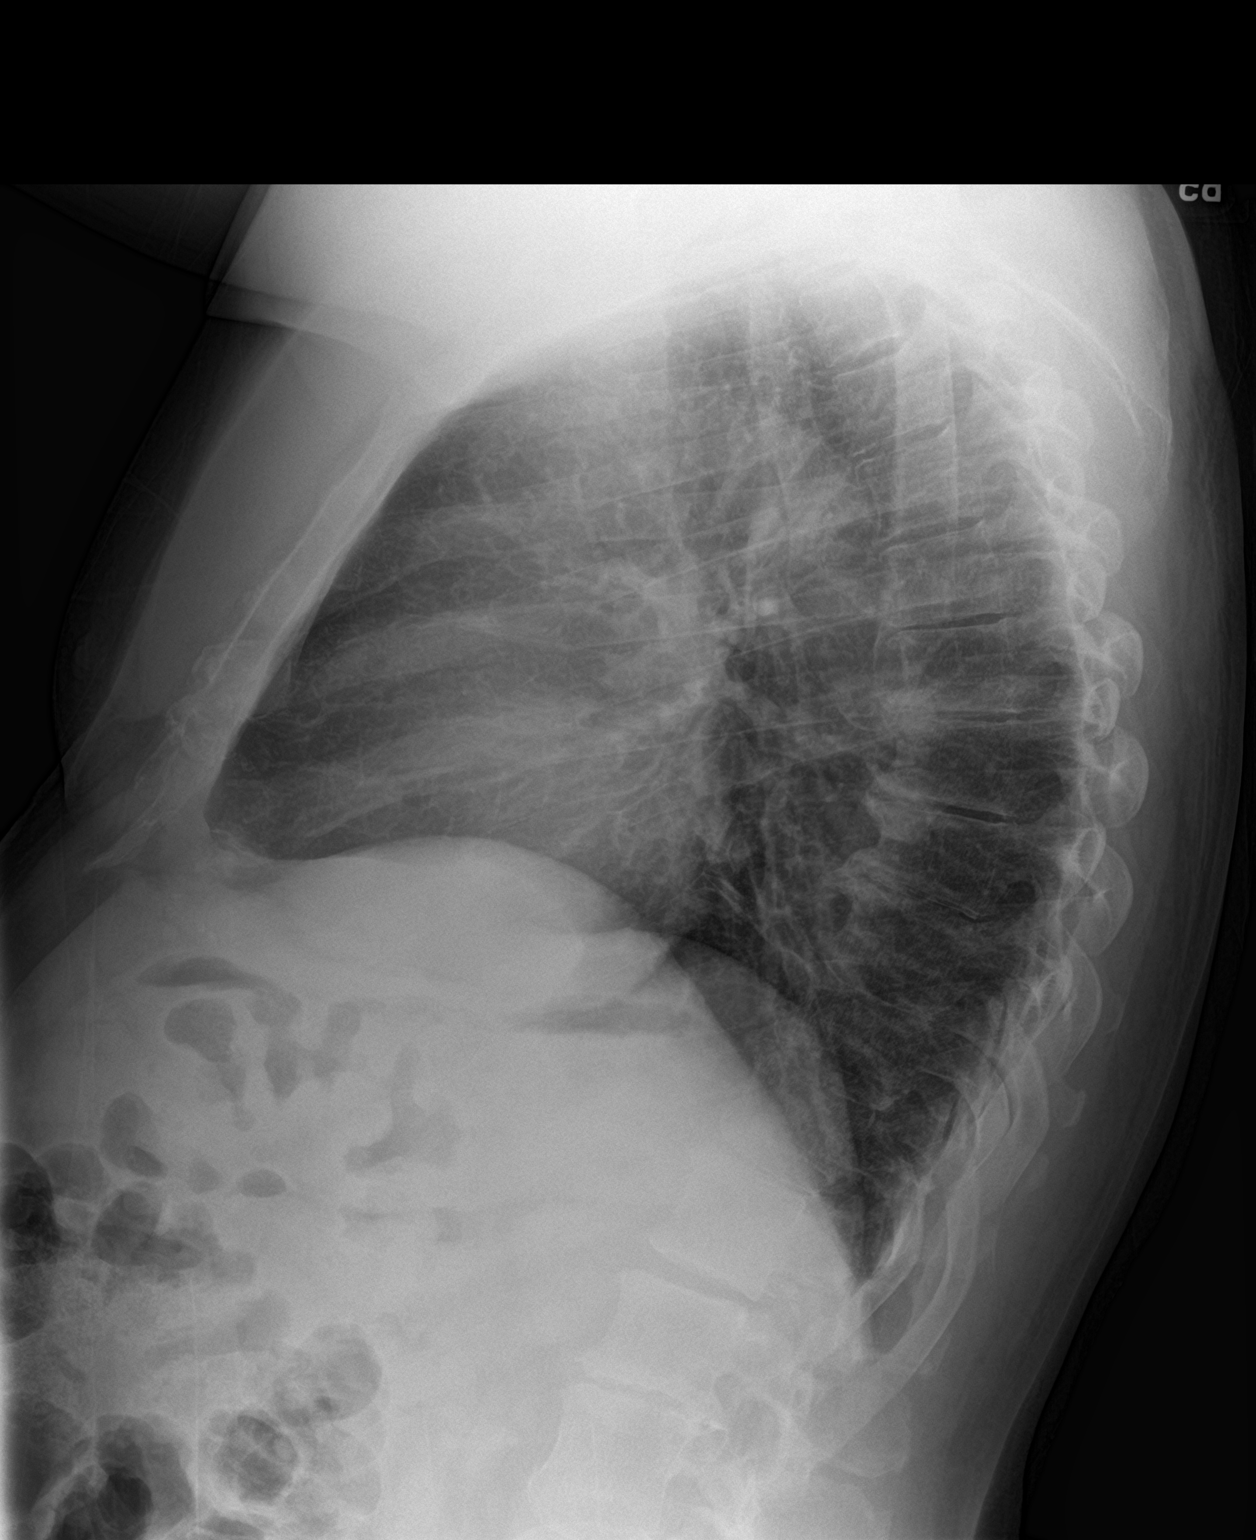

[2 of 2 positions shown; findings below may reference images not displayed]

FINDINGS: No focal airspace disease. No pleural effusion. Borderline
cardiomegaly. No pneumothorax. Degenerative changes of the spine.
IMPRESSION: No active cardiopulmonary disease.

## 2018-03-09 MED ORDER — PREDNISONE 20 MG PO TABS: 40.0000 mg | ORAL_TABLET | Freq: Every day | ORAL | 0 refills | Status: AC

## 2018-03-09 MED ORDER — DOXYCYCLINE HYCLATE 100 MG PO TABS: 100.0000 mg | ORAL_TABLET | Freq: Two times a day (BID) | ORAL | 0 refills | Status: AC

## 2018-03-09 NOTE — Progress Notes (Signed)
Subjective: congestion    03/08/18: Aaron [name] is a 67 y.o. male who presents for evaluation of nasal congestion, body aches, nonproductive cough, fatigue, and headache for 3 days.  Patient reports that his significant other was diagnosed with influenza B yesterday.  Patient reports that he has had similar symptoms and both have been without a fever.  Patient reports that initially his predominant symptom was cough with some chest tightness this weekend, which has been improving and shifted to his predominant symptom being nasal congestion.  Reports resolution of chest tightness and denies shortness of breath.  Patient reports being treated monthly recently for recurrent sinus infections.  Patient was advised to take Flonase daily, which he is compliant with.  Patient denies any history of allergies. Treatment to date: NyQuil.  Denies rash, nausea, vomiting, diarrhea, shortness of breath, wheezing, chest/back pain, ear pain, sore throat, difficulty swallowing, confusion, AMS, fever, chills, severe symptoms, or initial improvement and then worsening of symptoms.. History of smoking, asthma, COPD: Negative History of recurrent sinus and/or lung infections: Positive for recurrent sinus infections.  Negative for any previous lung infections. Has never seen ENT and has never been referred. Medical history: Hypertension only according to patient.  Reports only medication that he is currently taking his Flonase and lisinopril. Antibiotic use in the last 3 months: Positive for multiple antibiotics in the last 3 months.  03/09/18: Patient returns today for his follow-up appointment complaining of new onset intermittent wheezing and worsening cough.  Patient reports purulent sputum.  Patient denies chest pain but reports coughing so much that it has caused soreness in his abdominal musculature.  Denies any history of asthma, COPD, smoking, or wheezing.  Patient has been using the nasal spray as recommended and  reports that is helping with his nasal congestion.  T-max at home 99.3.  Denies any other new symptoms or any other concerns. Denies chest pain, SOB, palpitations, dizziness, or PE.    Review of Systems Pertinent items noted in HPI and remainder of comprehensive ROS otherwise negative.     Objective:  03/08/18: Physical Exam General: Awake, alert, and oriented. No acute distress. Well developed, hydrated and nourished. Appears stated age. Nontoxic appearance.  HEENT:  PND noted.  No erythema to posterior oropharynx.  No edema or exudates of pharynx or tonsils. No erythema or bulging of TM.  Mild erythema/edema to nasal mucosa. Sinuses nontender. Supple neck without adenopathy. Cardiac: Heart rate elevated at 108.  Heart rhythm normal. No murmurs, gallops, or rubs are auscultated. S1 and S2 are heard and are of normal intensity.  Respiratory: No signs of respiratory distress. Lungs clear. No tachypnea. Able to speak in full sentences without dyspnea. Nonlabored respirations.  Skin: Skin is warm, dry and intact. Appropriate color for ethnicity. No cyanosis noted.   03/09/18: Physical Exam General: Awake, alert, and oriented. No acute distress. Well developed, hydrated and nourished. Appears stated age. Nontoxic appearance.  HEENT: PND noted.  No erythema to posterior oropharynx.  No edema or exudates of pharynx or tonsils. No erythema or bulging of TM.  Mild erythema/edema to nasal mucosa. Sinuses nontender. Supple neck without adenopathy. Cardiac: Heart rate elevated at 112.  Heart rhythm normal. No murmurs, gallops, or rubs are auscultated. S1 and S2 are heard and are of normal intensity.  Respiratory: No signs of respiratory distress. Faint, generalized expiratory wheezing noted. No tachypnea. Able to speak in full sentences without dyspnea. Nonlabored respirations.  Skin: Skin is warm, dry and intact. Appropriate color for  ethnicity. No cyanosis noted.  03/08/18: Diagnostic Results: Flu testing  negative. 03/09/18: Diagnostic Results: CXR negative.   Assessment:    Flu-like illness   Acute bronchitis   Plan:   Discussed diagnosis and treatment of bronchitis.  Prescribed prednisone for wheezing. Patient reports tolerating this well in the past. Not prescribing albuterol at this time due to increased heart rate.  Discussed the importance of avoiding unnecessary antibiotic therapy. Delayed prescription for doxycycline given due to patient's age and worsening respiratory symptoms. Provided patient with explicit instructions of indications to get this filled.  Suggested symptomatic OTC remedies. Continue nasal saline spray for congestion. Advised the patient to follow up with his PCP Friday.  Advised patient to monitor his HR and if this elevates further or he experiences symptoms of SOB, CP, palpitations, dizziness or PE to seek call an ambulance.   Discussed red flag symptoms and circumstances with which to seek medical care.

## 2018-03-11 ENCOUNTER — Ambulatory Visit: Payer: Self-pay | Admitting: Family Medicine

## 2018-03-11 VITALS — BP 128/82 | HR 88 | Temp 98.3°F | Resp 20

## 2018-03-11 DIAGNOSIS — R062 Wheezing: Secondary | ICD-10-CM

## 2018-03-11 MED ORDER — ALBUTEROL SULFATE HFA 108 (90 BASE) MCG/ACT IN AERS
2.0000 | INHALATION_SPRAY | Freq: Four times a day (QID) | RESPIRATORY_TRACT | 0 refills | Status: DC | PRN
Start: 2018-03-11 — End: 2018-03-21

## 2018-03-11 MED ORDER — IPRATROPIUM-ALBUTEROL 0.5-2.5 (3) MG/3ML IN SOLN: 3.0000 mL | RESPIRATORY_TRACT | Status: DC | PRN

## 2018-03-11 NOTE — Progress Notes (Addendum)
Subjective: congestion    03/08/18: Aaron [name] is a 67 y.o. male who presents for evaluation of nasal congestion, body aches, nonproductive cough, fatigue, and headache for 3 days.  Patient reports that his significant other was diagnosed with influenza B yesterday.  Patient reports that he has had similar symptoms and both have been without a fever.  Patient reports that initially his predominant symptom was cough with some chest tightness this weekend, which has been improving and shifted to his predominant symptom being nasal congestion.  Reports resolution of chest tightness and denies shortness of breath.  Patient reports being treated monthly recently for recurrent sinus infections.  Patient was advised to take Flonase daily, which he is compliant with.  Patient denies any history of allergies. Treatment to date: NyQuil.  Denies rash, nausea, vomiting, diarrhea, shortness of breath, wheezing, chest/back pain, ear pain, sore throat, difficulty swallowing, confusion, AMS, fever, chills, severe symptoms, or initial improvement and then worsening of symptoms.. History of smoking, asthma, COPD: Negative History of recurrent sinus and/or lung infections: Positive for recurrent sinus infections.  Negative for any previous lung infections. Has never seen ENT and has never been referred. Medical history: Hypertension only according to patient.  Reports only medication that he is currently taking his Flonase and lisinopril. Antibiotic use in the last 3 months: Positive for multiple antibiotics in the last 3 months.  03/09/18: Patient returns today for his follow-up appointment complaining of new onset intermittent wheezing and worsening cough.  Patient reports purulent sputum.  Patient denies chest pain but reports coughing so much that it has caused soreness in his abdominal musculature.  Denies any history of asthma, COPD, smoking, or wheezing.  Patient has been using the nasal spray as recommended and  reports that is helping with his nasal congestion.  T-max at home 99.3.  Denies any other new symptoms or any other concerns. Denies chest pain, SOB, palpitations, dizziness, or PE.   03/11/18: Patient was unable to make an appointment with his PCP because he found out his PCP retired so he returns today for follow-up.  Patient reports continued wheezing despite starting prednisone yesterday.  Did not previously prescribe albuterol due to patient's elevated heart rate.  Patient's heart rate has significantly improved today, HR in the 80s.  Patient reports increasingly purulent nasal congestion and facial pressure.  Patient has not started antibiotic.  Describes cough as nonproductive.  Denies fever or chills.  Reports mild sore throat, which he attributes to PND. Denies any other symptoms.  First DuoNeb-patient reports feeling like he is able to take a deep breath finally an improvement in wheezing.  Heart rate 93 oxygenation 96%.  Still faint wheezing present.  Discussed doing an additional breathing treatment and patient feels like he would benefit from another.  Will administer another 20 minutes.  Second DuoNeb- patient reports feeling significantly better.  Patient states he does not feel like he is wheezing or having a hard time breathing.  Heart rate 98 and O2 sats 98%.  Patient declined third treatment.  Review of Systems Pertinent items noted in HPI and remainder of comprehensive ROS otherwise negative.     Objective:  03/08/18: Physical Exam General: Awake, alert, and oriented. No acute distress. Well developed, hydrated and nourished. Appears stated age. Nontoxic appearance.  HEENT:  PND noted.  No erythema to posterior oropharynx.  No edema or exudates of pharynx or tonsils. No erythema or bulging of TM.  Mild erythema/edema to nasal mucosa. Sinuses nontender. Supple neck  without adenopathy. Cardiac: Heart rate elevated at 108.  Heart rhythm normal. No murmurs, gallops, or rubs are  auscultated. S1 and S2 are heard and are of normal intensity.  Respiratory: No signs of respiratory distress. Lungs clear. No tachypnea. Able to speak in full sentences without dyspnea. Nonlabored respirations.  Skin: Skin is warm, dry and intact. Appropriate color for ethnicity. No cyanosis noted.   03/09/18: Physical Exam General: Awake, alert, and oriented. No acute distress. Well developed, hydrated and nourished. Appears stated age. Nontoxic appearance.  HEENT: PND noted.  No erythema to posterior oropharynx.  No edema or exudates of pharynx or tonsils. No erythema or bulging of TM.  Mild erythema/edema to nasal mucosa. Sinuses nontender. Supple neck without adenopathy. Cardiac: Heart rate elevated at 112.  Heart rhythm normal. No murmurs, gallops, or rubs are auscultated. S1 and S2 are heard and are of normal intensity.  Respiratory: No signs of respiratory distress. Faint, generalized expiratory wheezing noted. No tachypnea. Able to speak in full sentences without dyspnea. Nonlabored respirations.  Skin: Skin is warm, dry and intact. Appropriate color for ethnicity. No cyanosis noted.  03/11/18: Physical Exam General: Awake, alert, and oriented. No acute distress. Well developed, hydrated and nourished. Appears stated age. Nontoxic appearance.  HEENT: PND noted.  Mild erythema to posterior oropharynx.  No edema or exudates of pharynx or tonsils. No erythema or bulging of TM.  Mild erythema/edema to nasal mucosa. Sinuses nontender. Supple neck without adenopathy. Cardiac: Normal heart rate and rhythm.  No murmurs, gallops, or rubs are auscultated. S1 and S2 are heard and are of normal intensity.  Respiratory: No signs of respiratory distress.   Initially-bilateral generalized expiratory wheezing noted.   Following first DuoNeb- improved wheezing but still generalized bilateral faint wheezing noted.   Following second DuoNeb- improved wheezing - generalized bilateral very faint wheezing noted.   Patient appears significantly more comfortable  and is taking much deeper breaths.  No tachypnea. Able to speak in full sentences without dyspnea. Nonlabored respirations. 16 respirations a minute after breathing treatments. Skin: Skin is warm, dry and intact. Appropriate color for ethnicity. No cyanosis noted.  03/08/18: Diagnostic Results: Flu testing negative. 03/09/18: Diagnostic Results: CXR negative.   Assessment:   Acute bronchitis   Acute rhinosinusitis  Plan:  Patient received a total of 2 DuoNeb treatments 20 minutes apart.  Tolerated these well with significant symptomatic improvement. Discussed diagnosis and treatment and side/adverse effects of all medications. Continue prednisone. Prescribed albuterol to use as needed.  Advised patient to monitor BP and heart rate.  Discussed normal parameters and when to seek medical care. Doxycycline- begin today. Suggested symptomatic OTC remedies. Continue nasal saline spray for congestion. Advised the patient to follow up with his PCP early next week but he is unable to because the first available appointment with another PCP in the practice of his previous PCP is in early June.  Advised patient to follow-up with me next week.  Advised patient to call to try to make a sooner PCP appointment. ENT referral pending. Advised patient to monitor his HR and if this elevates further or he experiences symptoms of SOB, CP, palpitations, dizziness or PE to call an ambulance.   Discussed red flag symptoms and circumstances with which to seek medical care.

## 2018-03-16 ENCOUNTER — Telehealth: Payer: Self-pay

## 2018-03-16 NOTE — Telephone Encounter (Deleted)
error 

## 2018-03-17 ENCOUNTER — Ambulatory Visit: Payer: Self-pay | Admitting: Family Medicine

## 2018-03-17 VITALS — BP 141/97 | HR 95 | Resp 18

## 2018-03-17 DIAGNOSIS — N529 Male erectile dysfunction, unspecified: Secondary | ICD-10-CM

## 2018-03-17 MED ORDER — SILDENAFIL CITRATE 20 MG PO TABS: ORAL_TABLET | ORAL | 0 refills | Status: DC

## 2018-03-17 NOTE — Progress Notes (Signed)
Subjective: congestion    03/08/18: Aaron [name] is a 67 y.o. male who presents for evaluation of nasal congestion, body aches, nonproductive cough, fatigue, and headache for 3 days.  Patient reports that his significant other was diagnosed with influenza B yesterday.  Patient reports that he has had similar symptoms and both have been without a fever.  Patient reports that initially his predominant symptom was cough with some chest tightness this weekend, which has been improving and shifted to his predominant symptom being nasal congestion.  Reports resolution of chest tightness and denies shortness of breath.  Patient reports being treated monthly recently for recurrent sinus infections.  Patient was advised to take Flonase daily, which he is compliant with.  Patient denies any history of allergies. Treatment to date: NyQuil.  Denies rash, nausea, vomiting, diarrhea, shortness of breath, wheezing, chest/back pain, ear pain, sore throat, difficulty swallowing, confusion, AMS, fever, chills, severe symptoms, or initial improvement and then worsening of symptoms.. History of smoking, asthma, COPD: Negative History of recurrent sinus and/or lung infections: Positive for recurrent sinus infections.  Negative for any previous lung infections. Has never seen ENT and has never been referred. Medical history: Hypertension only according to patient.  Reports only medication that he is currently taking his Flonase and lisinopril. Antibiotic use in the last 3 months: Positive for multiple antibiotics in the last 3 months.  03/09/18: Patient returns today for his follow-up appointment complaining of new onset intermittent wheezing and worsening cough.  Patient reports purulent sputum.  Patient denies chest pain but reports coughing so much that it has caused soreness in his abdominal musculature.  Denies any history of asthma, COPD, smoking, or wheezing.  Patient has been using the nasal spray as recommended and  reports that is helping with his nasal congestion.  T-max at home 99.3.  Denies any other new symptoms or any other concerns. Denies chest pain, SOB, palpitations, dizziness, or PE.   03/11/18: Patient was unable to make an appointment with his PCP because he found out his PCP retired so he returns today for follow-up.  Patient reports continued wheezing despite starting prednisone yesterday.  Did not previously prescribe albuterol due to patient's elevated heart rate.  Patient's heart rate has significantly improved today, HR in the 80s.  Patient reports increasingly purulent nasal congestion and facial pressure.  Patient has not started antibiotic.  Describes cough as nonproductive.  Denies fever or chills.  Reports mild sore throat, which he attributes to PND. Denies any other symptoms.  First DuoNeb-patient reports feeling like he is able to take a deep breath finally an improvement in wheezing.  Heart rate 93 oxygenation 96%.  Still faint wheezing present.  Discussed doing an additional breathing treatment and patient feels like he would benefit from another.  Will administer another 20 minutes.  Second DuoNeb- patient reports feeling significantly better.  Patient states he does not feel like he is wheezing or having a hard time breathing.  Heart rate 98 and O2 sats 98%.  Patient declined third treatment.  03/17/18: Patient reports significant improvement in symptoms.  Patient reports being able to take a deep breath and denies shortness of breath except for with significant exertion at work.  Patient has returned to work and is tolerating this well.  Cough is not keeping patient up at night.  Cough is nonproductive.  Denies fever, chills, malaise, fatigue or any other systemic symptoms.  Denies chest or back pain. Only symptom is nonproductive cough and mild nasal  congestion. Denies any other symptoms or concerns.  Patient reports that the albuterol inhaler continues to help with his cough.  Patient  reports using this as needed a few times a day.  Patient completed course of prednisone and has a few days left of doxycycline.  Patient reports tolerating these well without any side effects.  Patient requesting refill for sildenafil.  Patient was prescribed this following prostate surgery and tolerates it well without any adverse effects.   Review of Systems Pertinent items noted in HPI and remainder of comprehensive ROS otherwise negative.     Objective:  Physical Exam General: Awake, alert, and oriented. No acute distress. Well developed, hydrated and nourished. Appears stated age. Nontoxic appearance.  HEENT: No PND noted.  No erythema to posterior oropharynx.  No edema or exudates of pharynx or tonsils. No erythema or bulging of TM.  Mild erythema/edema to nasal mucosa. Sinuses nontender. Supple neck without adenopathy. Cardiac: Normal heart rate and rhythm.  No murmurs, gallops, or rubs are auscultated. S1 and S2 are heard and are of normal intensity.  Respiratory: No signs of respiratory distress.  Lungs clear.  Respirations nonlabored.  Patient able to speak in full sentences without any dyspnea. Skin: Skin is warm, dry and intact. Appropriate color for ethnicity. No cyanosis noted.  Assessment:   Acute bronchitis   Acute rhinosinusitis  Plan:  Advised patient to complete course of doxycycline. Continue albuterol use as needed.  Advised patient to monitor BP and heart rate.  Discussed normal parameters and when to seek medical care.  BP 141/97 today, advised patient to continue to monitor BP even after completing therapy for acute illness.  Suggested symptomatic OTC remedies. Continue nasal saline spray for congestion. Continue daily Flonase use. Refill for sildenafil provided, informed patient future refills need to come from primary care provider. Patient has a PCP appointment scheduled in early June. Patient coming in for biometric screening appointment on Monday. 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 Discussed red flag symptoms and circumstances with which to seek medical care.

## 2018-03-21 ENCOUNTER — Ambulatory Visit: Payer: Self-pay | Admitting: Family Medicine

## 2018-03-21 VITALS — BP 148/88 | HR 79 | Temp 97.6°F | Resp 18 | Ht 65.0 in | Wt 180.0 lb

## 2018-03-21 DIAGNOSIS — Z008 Encounter for other general examination: Secondary | ICD-10-CM

## 2018-03-21 DIAGNOSIS — Z0189 Encounter for other specified special examinations: Principal | ICD-10-CM

## 2018-03-21 LAB — GLUCOSE, POCT (MANUAL RESULT ENTRY): POC GLUCOSE: 104 mg/dL — AB (ref 70–99)

## 2018-03-21 NOTE — Progress Notes (Signed)
Subjective: Annual biometrics screening  Patient presents for his annual biometric screening. Patient has a history of hypertension.  Patient reports that he monitors this at home and that it is usually in the 140s over 80s or lower.  This has been slightly higher in the last 2 weeks due to a recent illness.  Patient reports complete resolution of his symptoms and is asymptomatic without any concerns.  Patient has an upcoming PCP appointment scheduled. Patient works for the [employer]. Patient denies any other issues or concerns.   Review of Systems Unremarkable  Objective  Physical Exam General: Awake, alert and oriented. No acute distress. Well developed, hydrated and nourished. Appears stated age.  HEENT: Supple neck without adenopathy. Sclera is non-icteric. The ear canal is clear without discharge. The tympanic membrane is normal in appearance with normal landmarks and cone of light. Nasal mucosa is pink and moist. Oral mucosa is pink and moist. The pharynx is normal in appearance without tonsillar swelling or exudates.  Skin: Skin in warm, dry and intact without rashes or lesions. Appropriate color for ethnicity. Cardiac: Heart rate and rhythm are normal. No murmurs, gallops, or rubs are auscultated.  Respiratory: The chest wall is symmetric and without deformity. No signs of respiratory distress. Lung sounds are clear in all lobes bilaterally without rales, ronchi, or wheezes.  Neurological: The patient is awake, alert and oriented to person, place, and time with normal speech.  Memory is normal and thought processes intact. No gait abnormalities are appreciated.  Psychiatric: Appropriate mood and affect.   Assessment Annual biometrics screening  Plan  Lipid panel pending. Encouraged routine visits with primary care provider.  Fasting blood sugar is 104 today.  Discussed this with the patient, including implications of impaired fasting glucose.  Discussed dietary and exercise  modifications to improve this and advised him to follow-up with his primary care provider at his first appointment regarding this.  Blood pressure 148/88 today.  Advised patient to continue monitoring this at home and call or return to care if values are consistently above 140/90 or are significantly elevated in the interim before his first PCP appointment, as medication adjustment may be necessary.  Patient verbally agreed.  Advised patient to discuss with his primary care provider at his upcoming appointment as well.  Red flag symptoms and indications to seek medical care discussed.

## 2018-03-21 NOTE — Progress Notes (Signed)
Waist 32in

## 2018-03-22 LAB — LIPID PANEL
CHOL/HDL RATIO: 3.4 ratio (ref 0.0–5.0)
Cholesterol, Total: 149 mg/dL (ref 100–199)
HDL: 44 mg/dL (ref >39)
LDL CALC: 88 mg/dL (ref 0–99)
Triglycerides: 83 mg/dL (ref 0–149)
VLDL CHOLESTEROL CAL: 17 mg/dL (ref 5–40)

## 2018-04-01 ENCOUNTER — Ambulatory Visit: Payer: Self-pay | Admitting: Family Medicine

## 2018-04-01 VITALS — BP 163/98 | HR 92 | Temp 97.8°F | Resp 20

## 2018-04-01 DIAGNOSIS — N529 Male erectile dysfunction, unspecified: Secondary | ICD-10-CM

## 2018-04-01 DIAGNOSIS — Z87898 Personal history of other specified conditions: Secondary | ICD-10-CM

## 2018-04-01 NOTE — Progress Notes (Addendum)
Subjective: cough     Aaron [name] is a 67 y.o. male who presents for evaluation of a productive cough with green sputum, nasal congestion, body aches, chills, sore throat, and fatigue for 3 to 4 days.  Patient denies any improvement in his cough or nasal congestion.  Patient reports improvement in body aches and chills and resolution of his sore throat.  Patient denies fever.  Patient denies coughing or shortness of breath when lying flat.  Patient reports he only coughs during the day.  One month ago the patient was treated for bronchitis and reports complete resolution of his symptoms prior to the onset of his current symptoms 3 to 4 days ago.  Prior to that the patient was treated multiple times with antibiotics for recurrent sinusitis.  Patient was referred to ENT but his appointment is not until 04/19/2018.  Patient does not currently have a PCP but has scheduled an appointment for early June to establish care. Treatment to date: NyQuil and daily Flonase.  Denies rash, nausea, vomiting, diarrhea, shortness of breath, wheezing, chest or back pain, ear pain, difficulty swallowing, confusion, dental/facial pressure, headache, fever, severe symptoms, or initial improvement and then worsening of symptoms. History of smoking, asthma, COPD: Negative. History of recurrent sinus and/or lung infections: Positive as described above. Medical history: Hypertension, erectile dysfunction, and recurrent sinusitis. Patient is also requesting a refill of his sildenafil to hold him over until his primary care provider appointment in early June.  Patient denies needing a refill for his lisinopril.   Review of Systems Pertinent items noted in HPI and remainder of comprehensive ROS otherwise negative.     Objective:   Physical Exam General: Awake, alert, and oriented. No acute distress. Well developed, hydrated and nourished. Appears stated age. Nontoxic appearance.  HEENT:  PND noted.  No erythema to posterior  oropharynx.  No edema or exudates of pharynx or tonsils. No erythema or bulging of TM.  Mild erythema/edema to nasal mucosa. Sinuses nontender. Supple neck without adenopathy. Cardiac: Heart rate and rhythm are normal. No murmurs, gallops, or rubs are auscultated. S1 and S2 are heard and are of normal intensity.  Respiratory: No signs of respiratory distress. Lungs clear. No tachypnea. Able to speak in full sentences without dyspnea. Nonlabored respirations.  Skin: Skin is warm, dry and intact. Appropriate color for ethnicity. No cyanosis noted.   Assessment:    viral upper respiratory illness   Plan:    Discussed diagnosis and treatment of URI. Discussed the importance of avoiding unnecessary antibiotic therapy. Suggested symptomatic OTC remedies. Nasal saline spray for congestion.   Pulmonology referral placed because although the patient is not wheezing now, he has had recurrent upper respiratory illnesses with significant wheezing present in the past year of new onset. Monitor blood pressure daily at home and report elevations over 140/90. Follow-up with ENT on 5/21.  Discussed red flag symptoms and circumstances with which to seek medical care.  Follow-up appointment scheduled on this upcoming Monday. Fulfilled refill request over the phone with the patient's pharmacy for one more month of sildenafil.

## 2018-04-04 ENCOUNTER — Ambulatory Visit: Payer: Self-pay | Admitting: Family Medicine

## 2018-04-04 VITALS — BP 148/92 | HR 78 | Resp 16

## 2018-04-04 DIAGNOSIS — J069 Acute upper respiratory infection, unspecified: Secondary | ICD-10-CM

## 2018-04-04 NOTE — Progress Notes (Signed)
Subjective: cough    04/01/2018: Aaron [name] is a 67 y.o. male who presents for evaluation of a productive cough with green sputum, nasal congestion, body aches, chills, sore throat, and fatigue for 3 to 4 days.  Patient denies any improvement in his cough or nasal congestion.  Patient reports improvement in body aches and chills and resolution of his sore throat.  Patient denies fever.  Patient denies coughing or shortness of breath when lying flat.  Patient reports he only coughs during the day.  One month ago the patient was treated for bronchitis and reports complete resolution of his symptoms prior to the onset of his current symptoms 3 to 4 days ago.  Prior to that the patient was treated multiple times with antibiotics for recurrent sinusitis.  Patient was referred to ENT but his appointment is not until 04/19/2018.  Patient does not currently have a PCP but has scheduled an appointment for early June to establish care. Treatment to date: NyQuil and daily Flonase.  Denies rash, nausea, vomiting, diarrhea, shortness of breath, wheezing, chest or back pain, ear pain, difficulty swallowing, confusion, dental/facial pressure, headache, fever, severe symptoms, or initial improvement and then worsening of symptoms. History of smoking, asthma, COPD: Negative. History of recurrent sinus and/or lung infections: Positive as described above. Medical history: Hypertension, erectile dysfunction, and recurrent sinusitis. Patient is also requesting a refill of his sildenafil to hold him over until his primary care provider appointment in early June.  Patient denies needing a refill for his lisinopril.   04/04/2018: Patient returns today for follow-up and reports feeling significantly better.  Patient has been using nasal saline spray and Mucinex with significant symptomatic relief.  Patient reports improvement in his cough and resolution of all of his other symptoms, with the exception of his nasal congestion, which  has remained the same.  Denies any new symptoms.  Denies any worsening of symptoms.  Denies fever or chills.  Review of Systems Pertinent items noted in HPI and remainder of comprehensive ROS otherwise negative.     Objective:   Physical Exam General: Awake, alert, and oriented. No acute distress. Well developed, hydrated and nourished. Appears stated age. Nontoxic appearance.  HEENT:  PND noted.  No erythema to posterior oropharynx.  No edema or exudates of pharynx or tonsils. No erythema or bulging of TM.  Mild erythema/edema to nasal mucosa. Sinuses nontender. Supple neck without adenopathy. Cardiac: Heart rate and rhythm are normal. No murmurs, gallops, or rubs are auscultated. S1 and S2 are heard and are of normal intensity.  Respiratory: No signs of respiratory distress. Lungs clear. No tachypnea. Able to speak in full sentences without dyspnea. Nonlabored respirations.  Skin: Skin is warm, dry and intact. Appropriate color for ethnicity. No cyanosis noted.   Assessment:    viral upper respiratory illness   Plan:    Discussed diagnosis and treatment of URI. Discussed the importance of avoiding unnecessary antibiotic therapy. Suggested symptomatic OTC remedies. Nasal saline spray for congestion.   Pulmonology referral pending. Encouraged rest and fluids. Advised patient to monitor his blood pressure daily at home and report elevations over 140/90.  Patient reports that he regularly monitors his blood pressure and that when he is not sick his blood pressure remains in the 130s over 80s.  Denies significant elevations. Follow-up with ENT on 5/21.  Discussed red flag symptoms and circumstances with which to seek medical care.

## 2018-04-12 NOTE — Progress Notes (Signed)
[facility] Consultation      Assessment and Plan:  Chronic bronchitis with dyspnea. - Symptoms of chronic bronchitis, with frequent recurrences of acute bronchitis over the past few months, which appear to be related to sinus drainage. - Continue current nasal spray, patient has an upcoming appointment with ENT, and is encouraged to go to that appointment for further recommendations. - As the patient's symptoms appeared to go away between episodes, I asked that he use a rescue inhaler 1 to 2 puffs as needed for wheezing.  He currently has a inhaler but it is not listed in our medication list.  Therefore he is asked to call us back with the name of this inhaler and we can instruct him on how to use it. -Patient is otherwise asked to follow-up in 6 months, but call us back earlier if he is having recurrent symptoms of dyspnea cough or wheezing.  Chronic rhinitis --continus nasal spray.    Date: 04/13/2018  MRN# [id number] Aaron [name] [birth year]   Aaron [name] is a 68 y.o. old male seen in consultation for chief complaint of:    Chief Complaint  Patient presents with  . Consult    Referred by D'Jernes for wheezing;  . Cough    clear mucus and nasal congestion/ Pt has appt with ENT on 5/30    HPI:  He has been getting periodic bouts of respiratory congestion with sinus congestion. He has been getting courses of abx and prednisone.  Between these episodes his breathing returns to normal. When he has the episodes he feels that he is wheezing.  He has never been a smoker, his dad smoked. He works for [employer], no occupational exposures. He has never been tested for allergies, and does not have seasonal allergic problems. He has a dog a 2 cats, not in bedroom.  He takes flonase as it was recommended at one point.  He has reflux, takes omeprazole once daily on most days. If he does not take it for 2 or 3 days he will get reflux.   He works 12 hour shifts,  alternating days and nights in 2 week cycles.    Imaging personally reviewed, chest x-ray 03/09/2018, mild changes of chronic bronchitis, mild scoliosis with reduced lung volumes, otherwise normal.  Abs eos coung 05/17/17; 200.   PMHX:   Past Medical History:  Diagnosis Date  . Enlarged prostate   . GERD (gastroesophageal reflux disease)    Surgical Hx:  Past Surgical History:  Procedure Laterality Date  . TONSILLECTOMY     age 85   Family Hx:  Family History  Problem Relation Age of Onset  . Diabetes Paternal Grandmother    Social Hx:   Social History   Tobacco Use  . Smoking status: Never Smoker  . Smokeless tobacco: Never Used  Substance Use Topics  . Alcohol use: No    Alcohol/week: 0.0 oz  . Drug use: No   Medication:    Current Outpatient Medications:  .  fluticasone (FLONASE) 50 MCG/ACT nasal spray, Place 2 sprays into both nostrils daily., Disp: , Rfl:  .  lisinopril (PRINIVIL,ZESTRIL) 10 MG tablet, Take 1 tablet (10 mg total) by mouth daily. Needs labs in office  !, Disp: 30 tablet, Rfl: 6 .  sildenafil (REVATIO) 20 MG tablet, Take 20 mg by mouth 3 (three) times daily as needed., Disp: , Rfl:    Allergies:  Patient has no known allergies.  Review of Systems: Gen:  Denies  fever, sweats, chills HEENT: Denies blurred vision, double vision. bleeds, sore throat Cvc:  No dizziness, chest pain. Resp:   Denies cough or sputum production, shortness of breath Gi: Denies swallowing difficulty, stomach pain. Gu:  Denies bladder incontinence, burning urine Ext:   No Joint pain, stiffness. Skin: No skin rash,  hives  Endoc:  No polyuria, polydipsia. Psych: No depression, insomnia. Other:  All other systems were reviewed with the patient and were negative other that what is mentioned in the HPI.   Physical Examination:   VS: BP 134/88 (BP Location: Left Arm, Cuff Size: Normal)   Pulse 91   Resp 16   Ht 5\' 5"  (1.651 m)   Wt 180 lb (81.6 kg)   SpO2 99%   BMI  29.95 kg/m   General Appearance: No distress  Neuro:without focal findings,  speech normal,  HEENT: PERRLA, EOM intact.   Pulmonary: normal breath sounds, No wheezing.  CardiovascularNormal S1,S2.  No m/r/g.   Abdomen: Benign, Soft, non-tender. Renal:  No costovertebral tenderness  GU:  No performed at this time. Endoc: No evident thyromegaly, no signs of acromegaly. Skin:   warm, no rashes, no ecchymosis  Extremities: normal, no cyanosis, clubbing.  Other findings:    LABORATORY PANEL:   CBC No results for input(s): WBC, HGB, HCT, PLT in the last 168 hours. ------------------------------------------------------------------------------------------------------------------  Chemistries  No results for input(s): NA, K, CL, CO2, GLUCOSE, BUN, CREATININE, CALCIUM, MG, AST, ALT, ALKPHOS, BILITOT in the last 168 hours.  Invalid input(s): GFRCGP ------------------------------------------------------------------------------------------------------------------  Cardiac Enzymes No results for input(s): TROPONINI in the last 168 hours. ------------------------------------------------------------  RADIOLOGY:  No results found.     Thank  you for the consultation and for allowing [facility] to assist in the care of your patient. Our recommendations are noted above.  Please contact us if we can be of further service.   Marda Stalker, MD.  Board Certified in Internal Medicine, Pulmonary Medicine, [place], and Sleep Medicine.  [facility] Office Number: [phone]  Patricia Pesa, M.D.  Merton Border, M.D  04/13/2018

## 2018-04-13 ENCOUNTER — Telehealth: Payer: Self-pay | Admitting: Internal Medicine

## 2018-04-13 ENCOUNTER — Ambulatory Visit (INDEPENDENT_AMBULATORY_CARE_PROVIDER_SITE_OTHER): Payer: Managed Care, Other (non HMO) | Admitting: Internal Medicine

## 2018-04-13 ENCOUNTER — Encounter: Payer: Self-pay | Admitting: Internal Medicine

## 2018-04-13 VITALS — BP 134/88 | HR 91 | Resp 16 | Ht 65.0 in | Wt 180.0 lb

## 2018-04-13 DIAGNOSIS — J42 Unspecified chronic bronchitis: Secondary | ICD-10-CM | POA: Diagnosis not present

## 2018-04-13 DIAGNOSIS — J310 Chronic rhinitis: Secondary | ICD-10-CM

## 2018-04-13 NOTE — Patient Instructions (Addendum)
Call us back with the name of the inhaler that you were prescribed. Can probably start to use that inhaler as needed for symptoms of cough, trouble breathing, wheezing.   Call back if breathing or coughing/wheezing comes back.

## 2018-04-13 NOTE — Telephone Encounter (Signed)
LMTCB

## 2018-04-13 NOTE — Telephone Encounter (Signed)
He should use this as needed. 1-2 puffs every 6 hours as needed for wheezing or trouble breathing.

## 2018-04-13 NOTE — Telephone Encounter (Signed)
Pt is calling back with the inhaler he is using-Albuterol

## 2018-04-14 NOTE — Telephone Encounter (Signed)
Left direction that albuterol should be taken per Dr.Ramachandran.

## 2018-10-17 NOTE — Progress Notes (Signed)
Aaron [name]      Assessment and Plan:  Asthma with dyspnea on exertion. - Minimal baseline symptoms but symptoms appear to get worse with strenuous activity, and require several minutes of rest to resolve.  I have encouraged him to use albuterol as needed, he can also take 2 puffs before strenuous activity to see if this helps. - Given his age I would like to make sure that he is not a cardiac problem that is contributing to his dyspnea, therefore will refer to cardiology.  Chronic rhinitis. - May be contributing to asthma/chronic bronchitis. - Continue nasal steroid.  Orders Placed This Encounter  Procedures  . Ambulatory referral to Cardiology   Return in about 1 year (around 10/19/2019).   Date: 10/17/2018  MRN# [id number] Aaron [name] 01-13-1951   Aaron [name] is a 67 y.o. old male seen in [name] for chief complaint of:    Chief Complaint  Patient presents with  . Shortness of Breath    noticed upon exertion, he is better than last visit.    HPI:  The patient is 67 year old male with bouts of respiratory congestion with sinus congestion, between the episodes he typically goes back to normal.  During these episodes he is treated with antibiotics and prednisone with improvement.  He was asked to continue his rescue inhaler as needed.  I suspected that sinus drainage was the main trigger, he has a referral to ENT, and was follow-up with [place] after his ENT evaluation.  Since his last visit he feels that his breathing is feeling better, he occasionally breathes heavy when he is busy but he does not feel winded or have wheezing. He has used his rescue inhaler twice over the past month. He saw ENT and nothing abnormal was apparently found. He finds that he has been having less recurrence of symptoms since his grandkids moved out.  He is on no other inhalers. He does continue on flonase once daily.   He has reflux, takes omeprazole once daily  on most days. If he does not take it for 2 or 3 days he will get reflux.   He works 12 hour shifts, alternating days and nights in 2 week cycles.    **chest x-ray 03/09/2018>> mild changes of chronic bronchitis, mild scoliosis with reduced lung volumes, otherwise normal.  Abs eos count 05/17/17; 200.   Medication:    Current Outpatient Medications:  .  fluticasone (FLONASE) 50 MCG/ACT nasal spray, Place 2 sprays into both nostrils daily., Disp: , Rfl:  .  lisinopril (PRINIVIL,ZESTRIL) 10 MG tablet, Take 1 tablet (10 mg total) by mouth daily. Needs labs in office  !, Disp: 30 tablet, Rfl: 6 .  sildenafil (REVATIO) 20 MG tablet, Take 20 mg by mouth 3 (three) times daily as needed., Disp: , Rfl:    Allergies:  Patient has no known allergies.    Review of Systems:  Constitutional: Feels well. Cardiovascular: Denies chest pain, exertional chest pain.  Pulmonary: Denies hemoptysis, pleuritic chest pain.   The remainder of systems were reviewed and were found to be negative other than what is documented in the HPI.    Physical Examination:   VS: Resp 16   Ht 5\' 5"  (1.651 m)   Wt 192 lb (87.1 kg)   BMI 31.95 kg/m   General Appearance: No distress  Neuro:without focal findings, mental status, speech normal, alert and oriented HEENT: PERRLA, EOM intact Pulmonary: No wheezing, No rales  CardiovascularNormal S1,S2.  No  m/r/g.  Abdomen: Benign, Soft, non-tender, No masses Renal:  No costovertebral tenderness  GU:  No performed at this time. Endoc: No evident thyromegaly, no signs of acromegaly or Cushing features Skin:   warm, no rashes, no ecchymosis  Extremities: normal, no cyanosis, clubbing.    LABORATORY PANEL:   CBC No results for input(s): WBC, HGB, HCT, PLT in the last 168 hours. ------------------------------------------------------------------------------------------------------------------  Chemistries  No results for input(s): NA, K, CL, CO2, GLUCOSE, BUN,  CREATININE, CALCIUM, MG, AST, ALT, ALKPHOS, BILITOT in the last 168 hours.  Invalid input(s): GFRCGP ------------------------------------------------------------------------------------------------------------------  Cardiac Enzymes No results for input(s): TROPONINI in the last 168 hours. ------------------------------------------------------------  RADIOLOGY:  No results found.     Thank  you for the [name] and for allowing [facility] to assist in the care of your patient. Our recommendations are noted above.  Please contact us if we can be of further service.  Marda Stalker, M.D., F.C.C.P.  Board Certified in Internal Medicine, Pulmonary Medicine, [place], and Sleep Medicine.  [facility] Office Number: [phone]  10/17/2018

## 2018-10-18 ENCOUNTER — Ambulatory Visit (INDEPENDENT_AMBULATORY_CARE_PROVIDER_SITE_OTHER): Payer: Managed Care, Other (non HMO) | Admitting: Internal Medicine

## 2018-10-18 ENCOUNTER — Encounter: Payer: Self-pay | Admitting: Internal Medicine

## 2018-10-18 VITALS — BP 140/100 | Resp 16 | Ht 65.0 in | Wt 192.0 lb

## 2018-10-18 DIAGNOSIS — J42 Unspecified chronic bronchitis: Secondary | ICD-10-CM

## 2018-10-18 DIAGNOSIS — R0600 Dyspnea, unspecified: Secondary | ICD-10-CM | POA: Diagnosis not present

## 2018-10-18 NOTE — Patient Instructions (Signed)
Try taking rescue inhaler 15 minutes before strenuous activity to see if it makes a difference.  Recommend a cardiac referral to make sure that your heart is ok.

## 2018-11-28 ENCOUNTER — Other Ambulatory Visit: Payer: Self-pay | Admitting: Nurse Practitioner

## 2018-11-28 DIAGNOSIS — R945 Abnormal results of liver function studies: Secondary | ICD-10-CM

## 2018-12-05 ENCOUNTER — Ambulatory Visit: Payer: Managed Care, Other (non HMO)

## 2018-12-08 ENCOUNTER — Ambulatory Visit: Payer: Self-pay | Admitting: Emergency Medicine

## 2018-12-08 VITALS — BP 140/96 | Ht 65.0 in | Wt 188.0 lb

## 2018-12-08 DIAGNOSIS — Z008 Encounter for other general examination: Secondary | ICD-10-CM

## 2018-12-08 DIAGNOSIS — Z0189 Encounter for other specified special examinations: Principal | ICD-10-CM

## 2018-12-08 NOTE — Progress Notes (Signed)
Biometric physical. Subjective: Patient enters for biometric physical.  Blood work was drawn. Smoking negative. Drinking social only. Exercise on a regular basis 3 days a week. Diet tries to follow a healthy low-fat diet. Review of systems: Recent problems with back pain and some radiation down the right leg. Undergoing evaluation for elevated iron. Recent cardiovascular work-up with Dr. Nehemiah Massed completed. Objective: Alert cooperative in no distress. Neck supple without adenopathy. Chest clear to auscultation. Heart regular rate and rhythm. Back exam:    Motor strength 5 out of 5.  Straight leg raising positive on the right at 85 degrees Assessment: Patient under treatment for COPD.  He has a pulmonary doctor.  He recently completed a cardiology work-up.  He does have a PCP and states he is up-to-date on immunizations.  He has a history of elevated PSA which has been followed on a routine basis.  He recently is scheduled to undergo evaluation of an elevated iron level.  Work-up pending.  I advised him this was important due to the genetic issues associated with hemochromatosis. Plan: Advised to get an order from his PCP regarding other blood work. Advised him to continue his work-up of his elevated iron. Advised him to continue to have follow-up of his elevated PSA. Advise core muscle strengthening exercises.

## 2018-12-08 NOTE — Patient Instructions (Signed)
Continue follow-up with your gastroenterologist regarding your elevated iron. Continue follow-up with your PCP. Continue follow-up with your pulmonary specialist. You can obtain written orders for further blood work you want to have done.

## 2018-12-09 LAB — LIPID PANEL WITH LDL/HDL RATIO
CHOLESTEROL TOTAL: 137 mg/dL (ref 100–199)
HDL: 39 mg/dL — ABNORMAL LOW (ref >39)
LDL Calculated: 84 mg/dL (ref 0–99)
LDl/HDL Ratio: 2.2 ratio (ref 0.0–3.6)
Triglycerides: 71 mg/dL (ref 0–149)
VLDL Cholesterol Cal: 14 mg/dL (ref 5–40)

## 2018-12-09 LAB — GLUCOSE, RANDOM: Glucose: 98 mg/dL (ref 65–99)

## 2019-06-22 ENCOUNTER — Other Ambulatory Visit: Payer: Self-pay | Admitting: Urology

## 2019-06-22 DIAGNOSIS — R972 Elevated prostate specific antigen [PSA]: Secondary | ICD-10-CM

## 2019-07-06 ENCOUNTER — Ambulatory Visit
Admission: RE | Admit: 2019-07-06 | Discharge: 2019-07-06 | Disposition: A | Discharge status: Home / Self Care | Payer: Managed Care, Other (non HMO) | Source: Ambulatory Visit | Attending: Urology | Admitting: Urology

## 2019-07-06 ENCOUNTER — Other Ambulatory Visit: Payer: Self-pay

## 2019-07-06 DIAGNOSIS — R972 Elevated prostate specific antigen [PSA]: Secondary | ICD-10-CM

## 2019-07-06 LAB — POCT I-STAT CREATININE: Creatinine, Ser: 1.1 mg/dL (ref 0.61–1.24)

## 2019-07-06 IMAGING — MR MR PROSTATE WO/W CM
56 series · 56 of 56 positions shown · IV contrast (Gadavist)
Comparison: Chest

CLINICAL DATA: Elevated PSA. Multiple prostate biopsies with
negative findings

EXAM:
MR PROSTATE WITHOUT AND WITH CONTRAST
TECHNIQUE: Multiplanar multisequence MRI images were obtained of the pelvis
centered about the prostate. Pre and post contrast images were
obtained.
CONTRAST:  8 mL Gadavist

[Series 3: T1 · axial · 8.0mm · 0.74mm/px · 1 of 25 slices shown (1 of 46)]
[im 1/25]
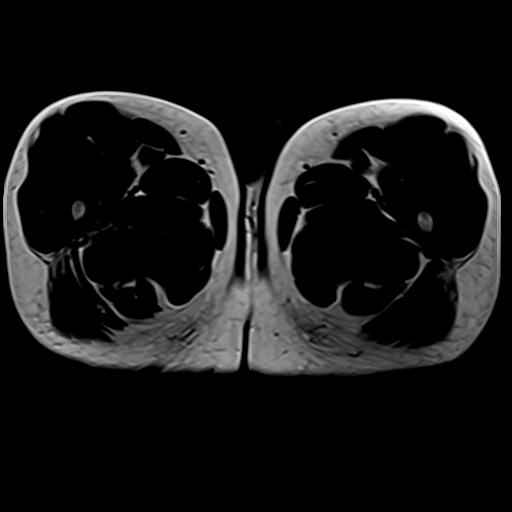

[Series 4: bSSFP fat-sat · axial · 8.0mm · 0.74mm/px · 1 of 25 slices shown]
[im 1/25]
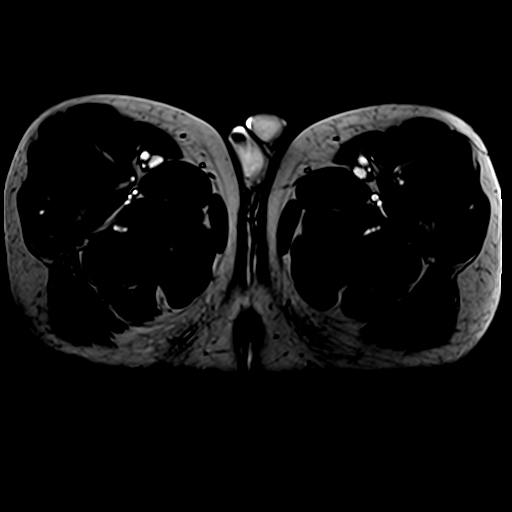

[Series 5: T2 · coronal · 3.0mm · 0.70mm/px · 1 of 30 slices shown (1 of 3)]
[im 1/30]
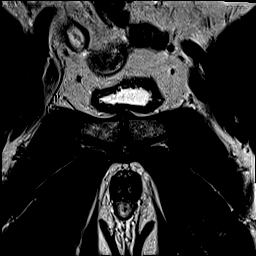

[Series 6: T2 · sagittal · 3.5mm · 0.62mm/px · 1 of 35 slices shown (2 of 3)]
[im 1/35]
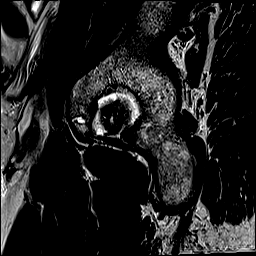

[Series 7: T1 · axial · 3.0mm · 0.35mm/px · 1 of 30 slices shown (2 of 46)]
[im 1/30]
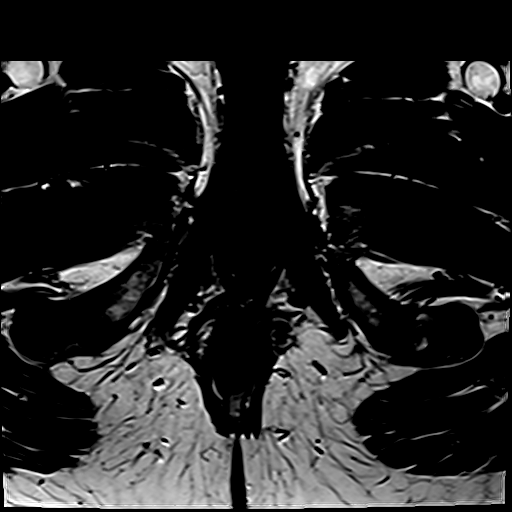

[Series 8: T2 · axial · 3.5mm · 0.56mm/px · 1 of 23 slices shown (3 of 3)]
[im 1/23]
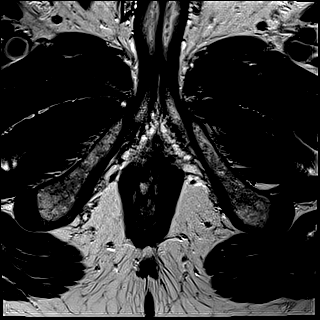

[Series 9: t2_space_tra (id) · axial · 1.0mm · 1.04mm/px · 1 of 80 slices shown]
[im 1/80]
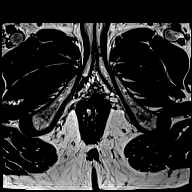

[Series 10: ax dwi_tracew · axial · 3.0mm · 0.78mm/px · 1 of 25 slices shown (1 of 3)]
[im 1/25]
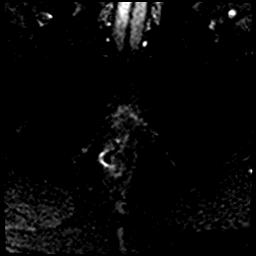

[Series 10: ax dwi_tracew · axial · 3.0mm · 0.78mm/px · 1 of 25 slices shown (2 of 3)]
[im 1/25]
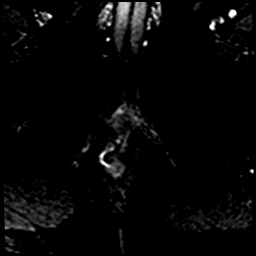

[Series 10: ax dwi_tracew · axial · 3.0mm · 0.78mm/px · 1 of 25 slices shown (3 of 3)]
[im 1/25]
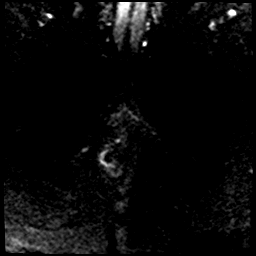

[Series 11: ax dwi_adc · axial · 3.0mm · 0.78mm/px · 1 of 25 slices shown]
[im 1/25]
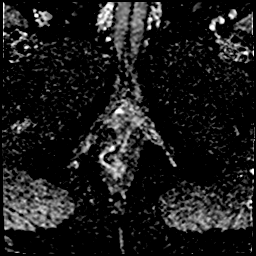

[Series 12: ax dwi_calc_bval · axial · 3.0mm · 0.78mm/px · 1 of 25 slices shown]
[im 1/25]
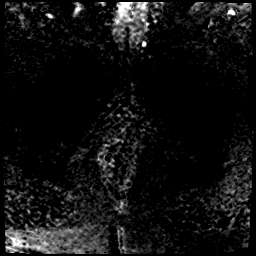

[Series 13: T1 · axial · 3.0mm · 1.15mm/px · 1 of 28 slices shown (3 of 46)]
[im 1/28]
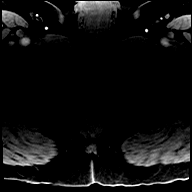

[Series 14: T1 · axial · 3.0mm · 1.15mm/px · 1 of 28 slices shown (4 of 46)]
[im 1/28]
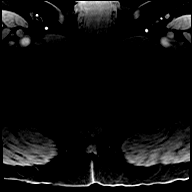

[Series 15: T1 · axial · 3.0mm · 1.15mm/px · 1 of 28 slices shown (5 of 46)]
[im 1/28]
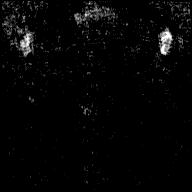

[Series 16: T1 · axial · 3.0mm · 1.15mm/px · 1 of 28 slices shown (6 of 46)]
[im 1/28]
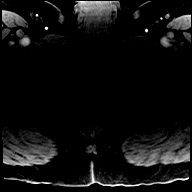

[Series 17: T1 · axial · 3.0mm · 1.15mm/px · 1 of 28 slices shown (7 of 46)]
[im 1/28]
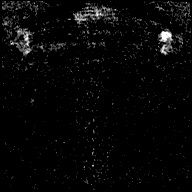

[Series 18: T1 · axial · 3.0mm · 1.15mm/px · 1 of 28 slices shown (8 of 46)]
[im 1/28]
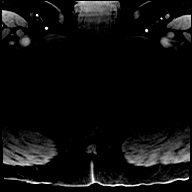

[Series 19: T1 · axial · 3.0mm · 1.15mm/px · 1 of 27 slices shown (9 of 46)]
[im 1/27]
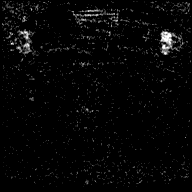

[Series 20: T1 · axial · 3.0mm · 1.15mm/px · 1 of 28 slices shown (10 of 46)]
[im 1/28]
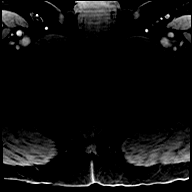

[Series 21: T1 · axial · 3.0mm · 1.15mm/px · 1 of 28 slices shown (11 of 46)]
[im 1/28]
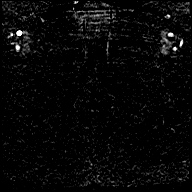

[Series 22: T1 · axial · 3.0mm · 1.15mm/px · 1 of 28 slices shown (12 of 46)]
[im 1/28]
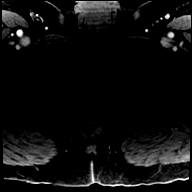

[Series 23: T1 · axial · 3.0mm · 1.15mm/px · 1 of 28 slices shown (13 of 46)]
[im 1/28]
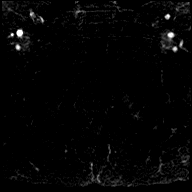

[Series 24: T1 · axial · 3.0mm · 1.15mm/px · 1 of 28 slices shown (14 of 46)]
[im 1/28]
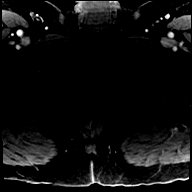

[Series 25: T1 · axial · 3.0mm · 1.15mm/px · 1 of 28 slices shown (15 of 46)]
[im 1/28]
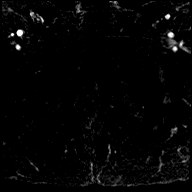

[Series 26: T1 · axial · 3.0mm · 1.15mm/px · 1 of 28 slices shown (16 of 46)]
[im 1/28]
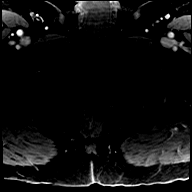

[Series 27: T1 · axial · 3.0mm · 1.15mm/px · 1 of 28 slices shown (17 of 46)]
[im 1/28]
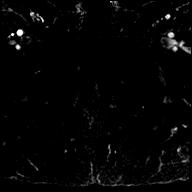

[Series 28: T1 · axial · 3.0mm · 1.15mm/px · 1 of 28 slices shown (18 of 46)]
[im 1/28]
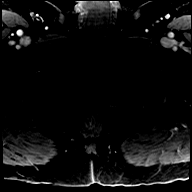

[Series 29: T1 · axial · 3.0mm · 1.15mm/px · 1 of 28 slices shown (19 of 46)]
[im 1/28]
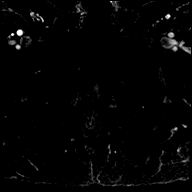

[Series 30: T1 · axial · 3.0mm · 1.15mm/px · 1 of 28 slices shown (20 of 46)]
[im 1/28]
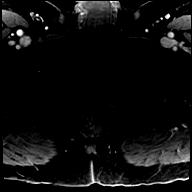

[Series 31: T1 · axial · 3.0mm · 1.15mm/px · 1 of 28 slices shown (21 of 46)]
[im 1/28]
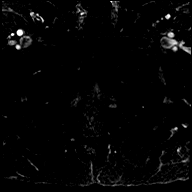

[Series 32: T1 · axial · 3.0mm · 1.15mm/px · 1 of 28 slices shown (22 of 46)]
[im 1/28]
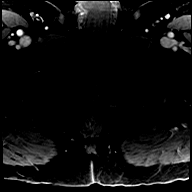

[Series 33: T1 · axial · 3.0mm · 1.15mm/px · 1 of 28 slices shown (23 of 46)]
[im 1/28]
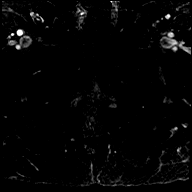

[Series 34: T1 · axial · 3.0mm · 1.15mm/px · 1 of 28 slices shown (24 of 46)]
[im 1/28]
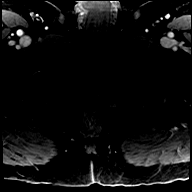

[Series 35: T1 · axial · 3.0mm · 1.15mm/px · 1 of 28 slices shown (25 of 46)]
[im 1/28]
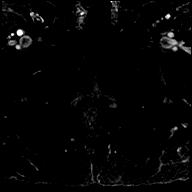

[Series 36: T1 · axial · 3.0mm · 1.15mm/px · 1 of 28 slices shown (26 of 46)]
[im 1/28]
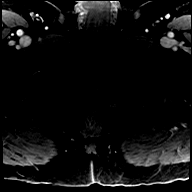

[Series 37: T1 · axial · 3.0mm · 1.15mm/px · 1 of 28 slices shown (27 of 46)]
[im 1/28]
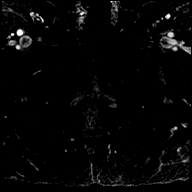

[Series 38: T1 · axial · 3.0mm · 1.15mm/px · 1 of 28 slices shown (28 of 46)]
[im 1/28]
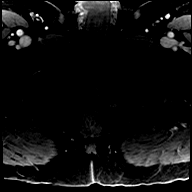

[Series 39: T1 · axial · 3.0mm · 1.15mm/px · 1 of 28 slices shown (29 of 46)]
[im 1/28]
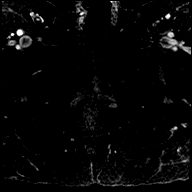

[Series 40: T1 · axial · 3.0mm · 1.15mm/px · 1 of 28 slices shown (30 of 46)]
[im 1/28]
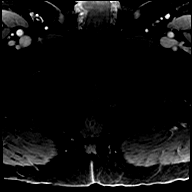

[Series 41: T1 · axial · 3.0mm · 1.15mm/px · 1 of 28 slices shown (31 of 46)]
[im 1/28]
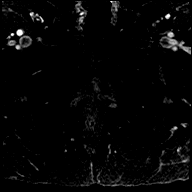

[Series 42: T1 · axial · 3.0mm · 1.15mm/px · 1 of 28 slices shown (32 of 46)]
[im 1/28]
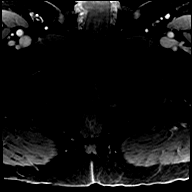

[Series 43: T1 · axial · 3.0mm · 1.15mm/px · 1 of 28 slices shown (33 of 46)]
[im 1/28]
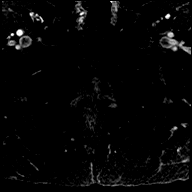

[Series 44: T1 · axial · 3.0mm · 1.15mm/px · 1 of 28 slices shown (34 of 46)]
[im 1/28]
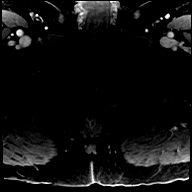

[Series 45: T1 · axial · 3.0mm · 1.15mm/px · 1 of 28 slices shown (35 of 46)]
[im 1/28]
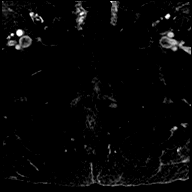

[Series 46: T1 · axial · 3.0mm · 1.15mm/px · 1 of 28 slices shown (36 of 46)]
[im 1/28]
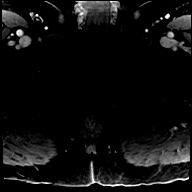

[Series 47: T1 · axial · 3.0mm · 1.15mm/px · 1 of 28 slices shown (37 of 46)]
[im 1/28]
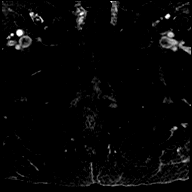

[Series 48: T1 · axial · 3.0mm · 1.15mm/px · 1 of 28 slices shown (38 of 46)]
[im 1/28]
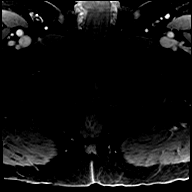

[Series 49: T1 · axial · 3.0mm · 1.15mm/px · 1 of 28 slices shown (39 of 46)]
[im 1/28]
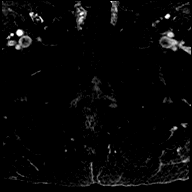

[Series 50: T1 · axial · 3.0mm · 1.15mm/px · 1 of 28 slices shown (40 of 46)]
[im 1/28]
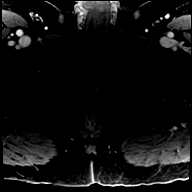

[Series 51: T1 · axial · 3.0mm · 1.15mm/px · 1 of 28 slices shown (41 of 46)]
[im 1/28]
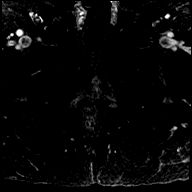

[Series 52: T1 · axial · 3.0mm · 1.15mm/px · 1 of 28 slices shown (42 of 46)]
[im 1/28]
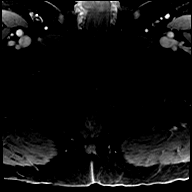

[Series 53: T1 · axial · 3.0mm · 1.15mm/px · 1 of 28 slices shown (43 of 46)]
[im 1/28]
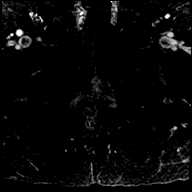

[Series 54: T1 · axial · 3.0mm · 1.15mm/px · 1 of 28 slices shown (44 of 46)]
[im 1/28]
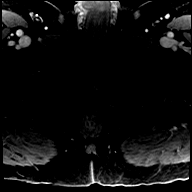

[Series 55: T1 · axial · 3.0mm · 1.15mm/px · 1 of 28 slices shown (45 of 46)]
[im 1/28]
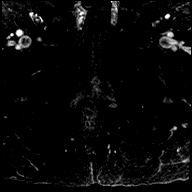

[Series 56: T1 · axial · 3.0mm · 1.15mm/px · 1 of 28 slices shown (46 of 46)]
[im 1/28]
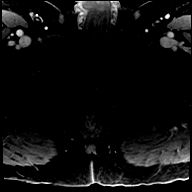

[56 of 56 positions shown; findings below may reference images not displayed]

FINDINGS: Prostate: Normal high signal intensity in the peripheral zone on T2
weighted imaging. No foci of restricted diffusion within the
peripheral zone (series 11).

The transitional zone is enlarged by multiple well capsulated
nodules with normal heterogeneous signal intensity.

Delayed imaging demonstrates no suspicious enhancing characteristics
in the peripheral zone.

Volume: 4.2 by 5.2 by 5.4 cm (volume = 62 cm^3)

Transcapsular spread:  Absent

Seminal vesicle involvement: Absent

Neurovascular bundle involvement: Absent

Pelvic adenopathy: Absent

Bone metastasis: Absent

Other findings: None
IMPRESSION: 1. No high-grade carcinoma in peripheral zone.
2. Enlarged nodular transitional zone most consistent with benign
prostate hypertrophy ( PI-RADS: 1).

## 2019-07-06 MED ORDER — GADOBUTROL 1 MMOL/ML IV SOLN
8.0000 mL | Freq: Once | INTRAVENOUS | Status: AC | PRN
  Administered 2019-07-06: 14:00:00 8 mL via INTRAVENOUS

## 2019-08-02 ENCOUNTER — Other Ambulatory Visit: Payer: Self-pay

## 2019-08-02 ENCOUNTER — Other Ambulatory Visit: Payer: Managed Care, Other (non HMO)

## 2019-08-02 DIAGNOSIS — N138 Other obstructive and reflux uropathy: Secondary | ICD-10-CM

## 2019-08-02 DIAGNOSIS — E291 Testicular hypofunction: Secondary | ICD-10-CM | POA: Diagnosis not present

## 2019-08-02 DIAGNOSIS — R972 Elevated prostate specific antigen [PSA]: Secondary | ICD-10-CM

## 2019-08-02 DIAGNOSIS — N401 Enlarged prostate with lower urinary tract symptoms: Secondary | ICD-10-CM

## 2019-08-03 LAB — PSA: Prostate Specific Ag, Serum: 10.1 ng/mL — ABNORMAL HIGH (ref 0.0–4.0)

## 2019-08-03 LAB — TESTOSTERONE: Testosterone: 192 ng/dL — ABNORMAL LOW (ref 264–916)

## 2019-09-05 ENCOUNTER — Other Ambulatory Visit: Payer: Managed Care, Other (non HMO)

## 2019-09-05 ENCOUNTER — Other Ambulatory Visit: Payer: Self-pay

## 2019-09-05 DIAGNOSIS — E291 Testicular hypofunction: Secondary | ICD-10-CM

## 2019-09-05 NOTE — Addendum Note (Signed)
Addended by: Judie Petit on: 09/05/2019 04:38 PM   Modules accepted: Orders

## 2019-09-06 LAB — TESTOSTERONE: Testosterone: 322 ng/dL (ref 264–916)

## 2019-09-28 ENCOUNTER — Other Ambulatory Visit: Payer: Managed Care, Other (non HMO) | Admitting: Medical

## 2019-09-28 ENCOUNTER — Other Ambulatory Visit: Payer: Self-pay

## 2019-09-28 DIAGNOSIS — E291 Testicular hypofunction: Secondary | ICD-10-CM | POA: Diagnosis not present

## 2019-09-28 NOTE — Addendum Note (Signed)
Addended by: Jay Schlichter D on: 09/28/2019 08:22 AM   Modules accepted: Orders, Level of Service

## 2019-09-28 NOTE — Progress Notes (Signed)
   Subjective:    Patient ID: Aaron [name], male    DOB: [birth year], 68 y.o.   MRN: [id number]  HPI 68 yo male in non acute distress, here for labs only.   Review of Systems     Objective:   Physical Exam        Assessment & Plan:

## 2019-09-29 LAB — TESTOSTERONE,FREE AND TOTAL
Testosterone, Free: 14.2 pg/mL (ref 6.6–18.1)
Testosterone: 352 ng/dL (ref 264–916)

## 2019-10-02 ENCOUNTER — Other Ambulatory Visit: Payer: Managed Care, Other (non HMO)

## 2019-10-02 ENCOUNTER — Other Ambulatory Visit: Payer: Self-pay

## 2019-10-02 DIAGNOSIS — E291 Testicular hypofunction: Secondary | ICD-10-CM | POA: Diagnosis not present

## 2019-10-03 LAB — TESTOSTERONE: Testosterone: 164 ng/dL — ABNORMAL LOW (ref 264–916)

## 2020-07-01 ENCOUNTER — Other Ambulatory Visit: Payer: Self-pay | Admitting: Urology

## 2020-07-01 DIAGNOSIS — R972 Elevated prostate specific antigen [PSA]: Secondary | ICD-10-CM

## 2020-07-10 ENCOUNTER — Encounter: Payer: Self-pay | Admitting: Emergency Medicine

## 2020-07-10 ENCOUNTER — Other Ambulatory Visit: Payer: Self-pay

## 2020-07-10 ENCOUNTER — Ambulatory Visit
Admission: EM | Admit: 2020-07-10 | Discharge: 2020-07-10 | Disposition: A | Discharge status: Home / Self Care | Payer: Managed Care, Other (non HMO) | Attending: Emergency Medicine | Admitting: Emergency Medicine

## 2020-07-10 DIAGNOSIS — S39012A Strain of muscle, fascia and tendon of lower back, initial encounter: Secondary | ICD-10-CM | POA: Diagnosis not present

## 2020-07-10 DIAGNOSIS — M62830 Muscle spasm of back: Secondary | ICD-10-CM | POA: Diagnosis not present

## 2020-07-10 MED ORDER — IBUPROFEN 800 MG PO TABS
800.0000 mg | ORAL_TABLET | Freq: Three times a day (TID) | ORAL | 0 refills | Status: AC
Start: 2020-07-10 — End: 2020-07-13

## 2020-07-10 MED ORDER — KETOROLAC TROMETHAMINE 60 MG/2ML IM SOLN
30.0000 mg | Freq: Once | INTRAMUSCULAR | Status: AC
  Administered 2020-07-10: 30 mg via INTRAMUSCULAR

## 2020-07-10 MED ORDER — CYCLOBENZAPRINE HCL 10 MG PO TABS
10.0000 mg | ORAL_TABLET | Freq: Two times a day (BID) | ORAL | 0 refills | Status: AC | PRN
Start: 2020-07-10 — End: 2020-07-15

## 2020-07-10 NOTE — ED Provider Notes (Signed)
[facility]    CSN: [id number] Arrival date & time: 07/10/20  1540      History   Chief Complaint Chief Complaint  Patient presents with  . Back Pain    HPI Aaron [name] is a 69 y.o. male.   The history is provided by the patient. No language interpreter was used.  Back Pain Location:  Lumbar spine Quality:  Burning and cramping Radiates to:  Does not radiate Pain severity:  Moderate Worse during: worse after sitting going to stance and  turning side to side. Onset quality:  Sudden Duration:  1 day Timing:  Intermittent Progression:  Waxing and waning Chronicity:  New Context: lifting heavy objects   Context comment:  No known injury, may have injured at work going up stairs or working out at gym Relieved by: standing. Worsened by:  Sitting and twisting Ineffective treatments:  Heating pad Associated symptoms comment:  No loss of bowel and bladder   Past Medical History:  Diagnosis Date  . Enlarged prostate   . GERD (gastroesophageal reflux disease)     Patient Active Problem List   Diagnosis Date Noted  . Strain of lumbar region 07/10/2020  . Muscle spasm of back 07/10/2020  . Essential hypertension, benign 09/02/2015  . Prediabetes 09/02/2015  . Skin cyst 08/22/2012    Past Surgical History:  Procedure Laterality Date  . TONSILLECTOMY     age 58       Home Medications    Prior to Admission medications   Medication Sig Start Date End Date Taking? Authorizing Provider  aspirin EC 81 MG tablet Take 81 mg by mouth daily.   Yes [provider]  azelastine (ASTELIN) 0.1 % nasal spray Place 1 spray into the nose 2 (two) times daily. 04/27/18  Yes [provider]  fluticasone (FLONASE) 50 MCG/ACT nasal spray Place 2 sprays into both nostrils daily.   Yes [provider]  losartan (COZAAR) 100 MG tablet  08/02/19  Yes [provider]  montelukast (SINGULAIR) 10 MG tablet Take 10 mg by mouth at bedtime.  04/27/18  Yes [provider]  omeprazole (PRILOSEC) 20 MG capsule Take by mouth. 05/12/19  Yes [provider]  sildenafil (REVATIO) 20 MG tablet TAKE 2-4 TABLETS BY MOUTH ONCE DAILY AS NEEDED 07/20/19  Yes [provider]  XYOSTED 75 MG/0.5ML SOAJ ADMISNISTER UNDER SKIN ONCE A WEEK 08/08/19  Yes [provider]  cyclobenzaprine (FLEXERIL) 10 MG tablet Take 1 tablet (10 mg total) by mouth 2 (two) times daily as needed for up to 5 days for muscle spasms. 01/12/07 6/57/84  Defelice, Jeanett Schlein, NP  ibuprofen (ADVIL) 800 MG tablet Take 1 tablet (800 mg total) by mouth 3 (three) times daily for 3 days. 6/96/29 04/26/40  Defelice, Jeanett Schlein, NP    Family History Family History  Problem Relation Age of Onset  . Diabetes Paternal Grandmother     Social History Social History   Tobacco Use  . Smoking status: Never Smoker  . Smokeless tobacco: Never Used  Vaping Use  . Vaping Use: Never used  Substance Use Topics  . Alcohol use: No    Alcohol/week: 0.0 standard drinks  . Drug use: No     Allergies   Patient has no known allergies.   Review of Systems Review of Systems  Musculoskeletal: Positive for back pain.  All other systems reviewed and are negative.    Physical Exam Triage Vital Signs ED Triage Vitals  Enc Vitals Group  BP 07/10/20 1613 (!) 143/96     Pulse Rate 07/10/20 1613 85     Resp 07/10/20 1613 18     Temp 07/10/20 1613 97.7 F (36.5 C)     Temp Source 07/10/20 1613 Oral     SpO2 07/10/20 1613 99 %     Weight 07/10/20 1612 190 lb (86.2 kg)     Height 07/10/20 1612 5\' 5"  (1.651 m)     Head Circumference --      Peak Flow --      Pain Score 07/10/20 1611 6     Pain Loc --      Pain Edu? --      Excl. in Gravity? --    No data found.  Updated Vital Signs BP (!) 143/96 (BP Location: Right Arm)   Pulse 85   Temp 97.7 F (36.5 C) (Oral)   Resp 18   Ht 5\' 5"  (1.651 m)   Wt 190 lb (86.2 kg)   SpO2 99%   BMI 31.62 kg/m    Visual Acuity Right Eye Distance:   Left Eye Distance:   Bilateral Distance:    Right Eye Near:   Left Eye Near:    Bilateral Near:     Physical Exam Vitals and nursing note reviewed.  Constitutional:      General: He is not in acute distress.    Appearance: He is well-developed. He is not ill-appearing or toxic-appearing.  HENT:     Head: Normocephalic.  Eyes:     Pupils: Pupils are equal, round, and reactive to light.  Cardiovascular:     Rate and Rhythm: Normal rate and regular rhythm.     Pulses: Normal pulses.     Heart sounds: Normal heart sounds.  Pulmonary:     Effort: Pulmonary effort is normal.     Breath sounds: Normal breath sounds.  Musculoskeletal:        General: Tenderness present.     Cervical back: Normal range of motion.     Lumbar back: Spasms and tenderness present. No swelling, edema, deformity or lacerations. Normal range of motion.       Back:     Comments: -SLE bilateral  Skin:    General: Skin is warm and dry.  Neurological:     General: No focal deficit present.     Mental Status: He is alert and oriented to person, place, and time.     GCS: GCS eye subscore is 4. GCS verbal subscore is 5. GCS motor subscore is 6.     Cranial Nerves: No cranial nerve deficit.     Sensory: No sensory deficit.     Comments: No foot drop, equal dorsi flex/ext bilateral  Psychiatric:        Speech: Speech normal.        Behavior: Behavior normal. Behavior is cooperative.      UC Treatments / Results  Labs (all labs ordered are listed, but only abnormal results are displayed) Labs Reviewed - No data to display  EKG   Radiology No results found.  Procedures Procedures (including critical care time)  Medications Ordered in UC Medications  ketorolac (TORADOL) injection 30 mg (30 mg Intramuscular Given 07/10/20 1707)    Initial Impression / Assessment and Plan / UC Course  I have reviewed the triage vital signs and the nursing notes.  Pertinent  labs & imaging results that were available during my care of the patient were reviewed by me and considered in  my medical decision making (see chart for details).     Final Clinical Impressions(s) / UC Diagnoses   Final diagnoses:  Strain of lumbar region, initial encounter  Muscle spasm of back     Discharge Instructions     Rest,ice to back, take meds as directed. Go to Er if you develop loss of bowel and bladder, loss of function, worsening symptoms.     ED Prescriptions    Medication Sig Dispense Auth. Provider   cyclobenzaprine (FLEXERIL) 10 MG tablet Take 1 tablet (10 mg total) by mouth 2 (two) times daily as needed for up to 5 days for muscle spasms. 10 tablet Defelice, Jeanette, NP   ibuprofen (ADVIL) 800 MG tablet Take 1 tablet (800 mg total) by mouth 3 (three) times daily for 3 days. 9 tablet Defelice, Jeanette, NP     I have reviewed the PDMP during this encounter.   Tori Milks, NP 00/17/49 1739

## 2020-07-10 NOTE — Discharge Instructions (Signed)
Rest,ice to back, take meds as directed. Go to Er if you develop loss of bowel and bladder, loss of function, worsening symptoms.

## 2020-07-10 NOTE — ED Triage Notes (Signed)
Patient c/o left side back pain that started yesterday. He denies injury.

## 2020-07-22 ENCOUNTER — Ambulatory Visit
Admission: RE | Admit: 2020-07-22 | Discharge: 2020-07-22 | Disposition: A | Discharge status: Home / Self Care | Payer: Managed Care, Other (non HMO) | Source: Ambulatory Visit | Attending: Urology | Admitting: Urology

## 2020-07-22 ENCOUNTER — Other Ambulatory Visit: Payer: Self-pay

## 2020-07-22 DIAGNOSIS — R972 Elevated prostate specific antigen [PSA]: Secondary | ICD-10-CM | POA: Insufficient documentation

## 2020-07-22 IMAGING — MR MR PROSTATE WO/W CM
33 series · 48 of 48 positions shown · IV contrast (9ml Gadavist)
Comparison: Prostate gland measures

CLINICAL DATA: Elevated PSAs. Prior negative biopsies. 5-biopsies.
Prior laser surgery to prostate gland [O9]. PSA equal 10.1 on
[DATE]

EXAM:
MR PROSTATE WITHOUT AND WITH CONTRAST
TECHNIQUE: Multiplanar multisequence MRI images were obtained of the pelvis
centered about the prostate. Pre and post contrast images were
obtained.
CONTRAST:  9mL GADAVIST GADOBUTROL 1 MMOL/ML IV SOLN

[Series 4: ax in&out whole · axial · 5.0mm · 0.74mm/px · z∈[-136,+68]mm · 4 of 70 slices shown]
[im 1/70]
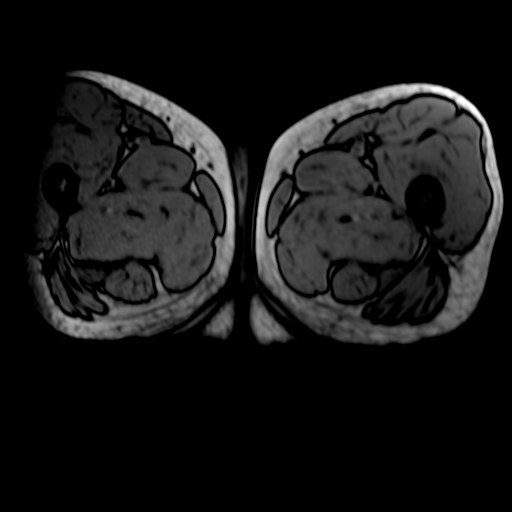
[im 24/70]
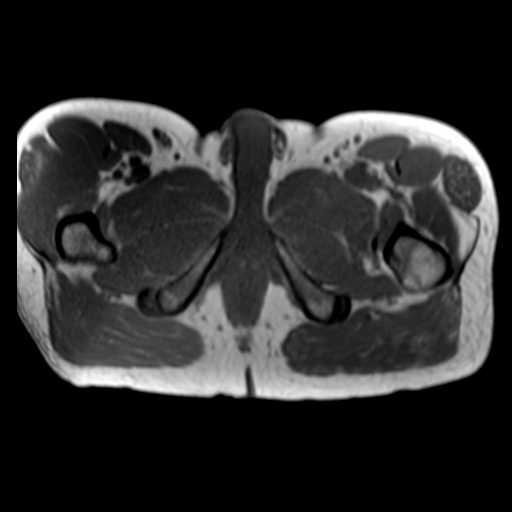
[im 47/70]
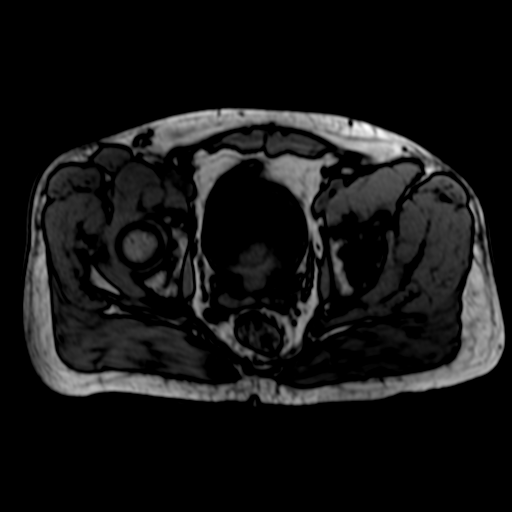
[im 70/70]
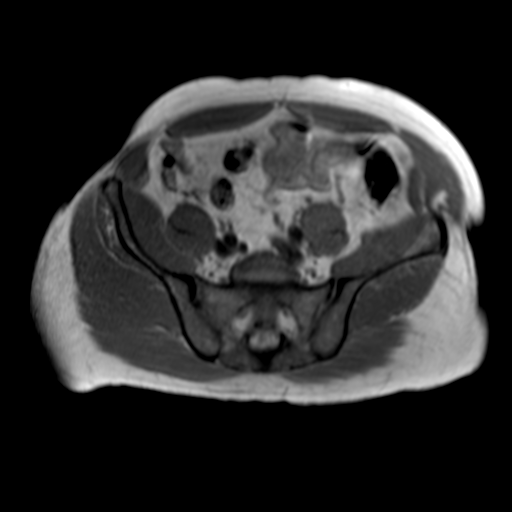

[Series 5: T2 · axial · 3.0mm · 0.56mm/px · z∈[-73,+11]mm · 2 of 29 slices shown (1 of 2)]
[im 1/29]
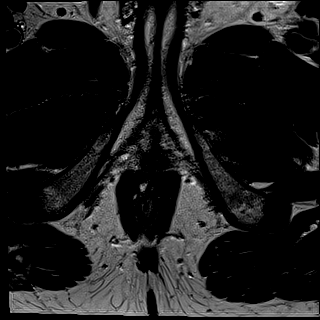
[im 29/29]
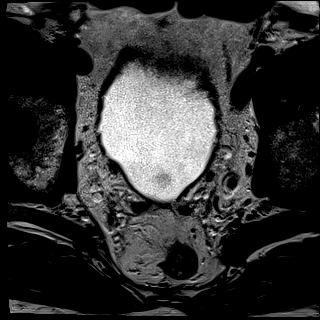

[Series 8: DWI · axial · 3.0mm · 0.86mm/px · z∈[-67,+5]mm · 2 of 25 slices shown (1 of 2)]
[im 1/25]
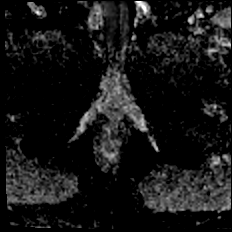
[im 25/25]
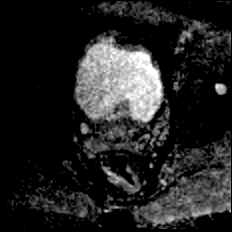

[Series 9: DWI · axial · 3.0mm · 0.86mm/px · z∈[-67,+5]mm · 2 of 25 slices shown (2 of 2)]
[im 1/25]
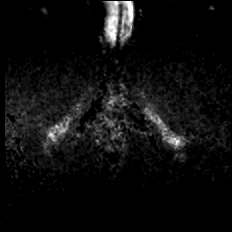
[im 25/25]
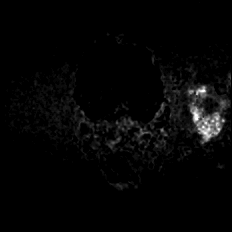

[Series 10: T2 · axial · 1.0mm · 1.04mm/px · z∈[-74,+13]mm · 5 of 88 slices shown (2 of 2)]
[im 1/88]
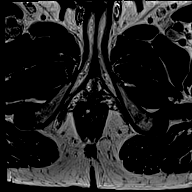
[im 22/88]
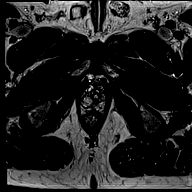
[im 44/88]
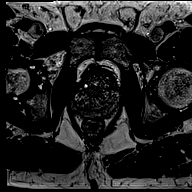
[im 66/88]
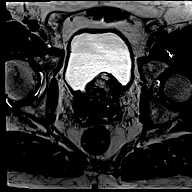
[im 88/88]
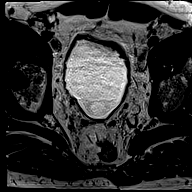

[Series 11: T1 · axial · 3.0mm · 1.15mm/px · z∈[-71,+10]mm · 2 of 28 slices shown (1 of 28)]
[im 1/28]
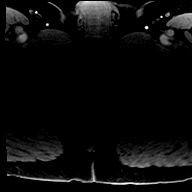
[im 28/28]
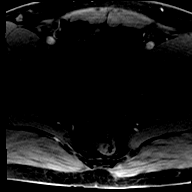

[Series 13: T1 · axial · 3.0mm · 1.15mm/px · z∈[-71,+10]mm · 2 of 28 slices shown (2 of 28)]
[im 1/28]
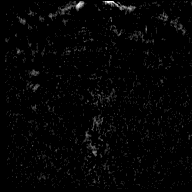
[im 28/28]
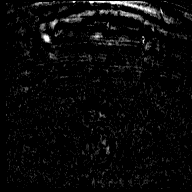

[Series 14: T1 · axial · 3.0mm · 1.15mm/px · z∈[-71,+10]mm · 2 of 28 slices shown (3 of 28)]
[im 1/28]
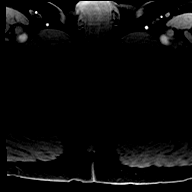
[im 28/28]
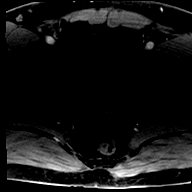

[Series 15: T1 · axial · 3.0mm · 1.15mm/px · z∈[-71,+10]mm · 2 of 28 slices shown (4 of 28)]
[im 1/28]
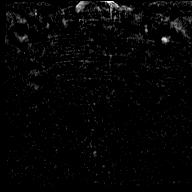
[im 28/28]
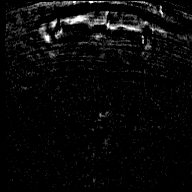

[Series 16: T1 · axial · 3.0mm · 1.15mm/px · z∈[-71,+10]mm · 2 of 28 slices shown (5 of 28)]
[im 1/28]
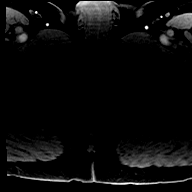
[im 28/28]
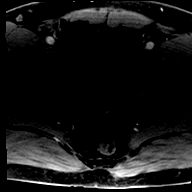

[Series 18: T1 · axial · 3.0mm · 1.15mm/px · 1 of 28 slices shown (6 of 28)]
[im 1/28]
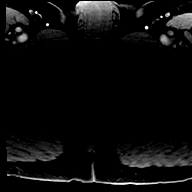

[Series 20: T1 · axial · 3.0mm · 1.15mm/px · 1 of 28 slices shown (7 of 28)]
[im 1/28]
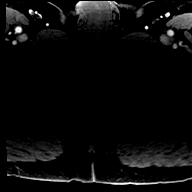

[Series 21: T1 · axial · 3.0mm · 1.15mm/px · 1 of 28 slices shown (8 of 28)]
[im 1/28]
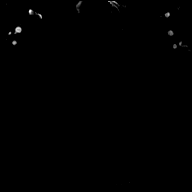

[Series 22: T1 · axial · 3.0mm · 1.15mm/px · 1 of 28 slices shown (9 of 28)]
[im 1/28]
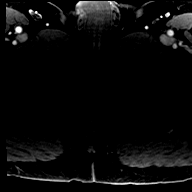

[Series 23: T1 · axial · 3.0mm · 1.15mm/px · 1 of 28 slices shown (10 of 28)]
[im 1/28]
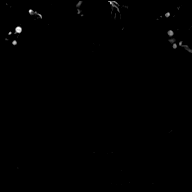

[Series 25: T1 · axial · 3.0mm · 1.15mm/px · 1 of 28 slices shown (11 of 28)]
[im 1/28]
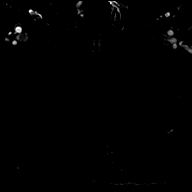

[Series 27: T1 · axial · 3.0mm · 1.15mm/px · 1 of 28 slices shown (12 of 28)]
[im 1/28]
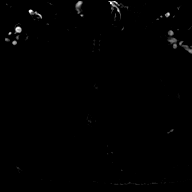

[Series 28: T1 · axial · 3.0mm · 1.15mm/px · 1 of 28 slices shown (13 of 28)]
[im 1/28]
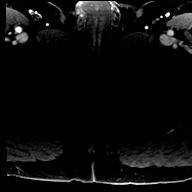

[Series 29: T1 · axial · 3.0mm · 1.15mm/px · 1 of 28 slices shown (14 of 28)]
[im 1/28]
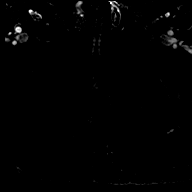

[Series 30: T1 · axial · 3.0mm · 1.15mm/px · 1 of 28 slices shown (15 of 28)]
[im 1/28]
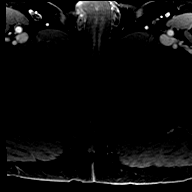

[Series 33: T1 · axial · 3.0mm · 1.15mm/px · 1 of 28 slices shown (16 of 28)]
[im 1/28]
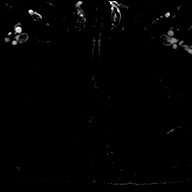

[Series 34: T1 · axial · 3.0mm · 1.15mm/px · 1 of 28 slices shown (17 of 28)]
[im 1/28]
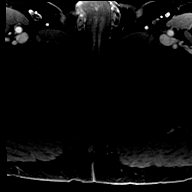

[Series 35: T1 · axial · 3.0mm · 1.15mm/px · 1 of 28 slices shown (18 of 28)]
[im 1/28]
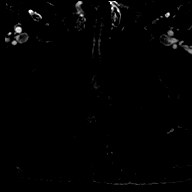

[Series 38: T1 · axial · 3.0mm · 1.15mm/px · 1 of 28 slices shown (19 of 28)]
[im 1/28]
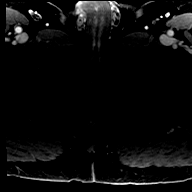

[Series 40: T1 · axial · 3.0mm · 1.15mm/px · 1 of 28 slices shown (20 of 28)]
[im 1/28]
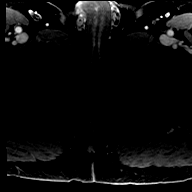

[Series 41: T1 · axial · 3.0mm · 1.15mm/px · 1 of 28 slices shown (21 of 28)]
[im 1/28]
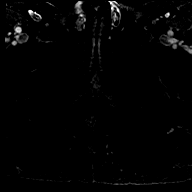

[Series 43: T1 · axial · 3.0mm · 1.15mm/px · 1 of 28 slices shown (22 of 28)]
[im 1/28]
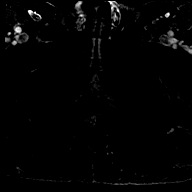

[Series 45: T1 · axial · 3.0mm · 1.15mm/px · 1 of 28 slices shown (23 of 28)]
[im 1/28]
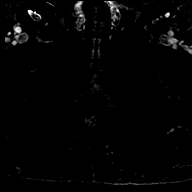

[Series 46: T1 · axial · 3.0mm · 1.15mm/px · 1 of 28 slices shown (24 of 28)]
[im 1/28]
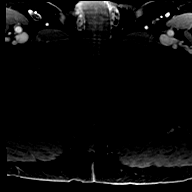

[Series 49: T1 · axial · 3.0mm · 1.15mm/px · 1 of 28 slices shown (25 of 28)]
[im 1/28]
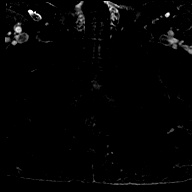

[Series 50: T1 · axial · 3.0mm · 1.15mm/px · 1 of 28 slices shown (26 of 28)]
[im 1/28]
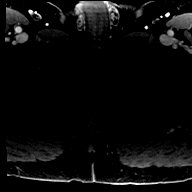

[Series 52: T1 · axial · 3.0mm · 1.15mm/px · 1 of 28 slices shown (27 of 28)]
[im 1/28]
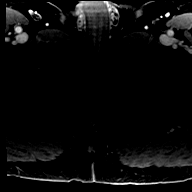

[Series 55: T1 · axial · 3.0mm · 1.15mm/px · 1 of 28 slices shown (28 of 28)]
[im 1/28]
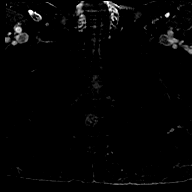

[48 of 48 positions shown; findings below may reference images not displayed]

FINDINGS: Prostate:

Normal high signal intensity within the peripheral zone on T2
weighted imaging (series 5). No foci of restricted diffusion within
the peripheral zone (series 25). No abnormal enhancement in the
peripheral zone.

Transitional zone is enlarged by multiple well capsulated nodules.
There is a defect within the RIGHT aspect of the transitional zone
which presumably relates to reported history of laser surgery for
benign prostate hypertrophy.

Seminal vesicles are normal. Prostatic capsule is intact.

Volume: 5.3 x 4.8 by 5.8 cm (volume = 77 cm^3)

Transcapsular spread:  Absent

Seminal vesicle involvement: Absent

Neurovascular bundle involvement: Absent

Pelvic adenopathy: Absent

Bone metastasis: Absent

Other findings: Sigmoid diverticulosis.
IMPRESSION: 1. No high-grade carcinoma in the peripheral zone.  PI-RADS: 1
2. Enlarged nodular transitional zone most consistent benign
prostate hypertrophy. PI-RADS: 2.

## 2020-07-22 MED ORDER — GADOBUTROL 1 MMOL/ML IV SOLN
9.0000 mL | Freq: Once | INTRAVENOUS | Status: AC | PRN
  Administered 2020-07-22: 9 mL via INTRAVENOUS

## 2020-09-26 ENCOUNTER — Ambulatory Visit: Payer: Managed Care, Other (non HMO) | Admitting: Physician Assistant

## 2020-09-26 ENCOUNTER — Other Ambulatory Visit: Payer: Self-pay

## 2020-09-26 ENCOUNTER — Encounter: Payer: Self-pay | Admitting: Physician Assistant

## 2020-09-26 VITALS — BP 134/85 | HR 82 | Temp 98.4°F | Resp 15 | Ht 65.0 in | Wt 194.0 lb

## 2020-09-26 DIAGNOSIS — Z0000 Encounter for general adult medical examination without abnormal findings: Secondary | ICD-10-CM

## 2020-09-26 DIAGNOSIS — J42 Unspecified chronic bronchitis: Secondary | ICD-10-CM

## 2020-09-26 DIAGNOSIS — Z008 Encounter for other general examination: Secondary | ICD-10-CM

## 2020-09-26 NOTE — Patient Instructions (Signed)
We discussed the importance of monitoring cholesterol and avoiding high cholesterol foods. We discussed the importance of proper rest. We discussed the need for scheduled ME time. Pt reports he usually keep a hobby. The most recent is "ax throwing". Glucose and Lipid profile obtained. Pt to be rescheduled for 1 year.

## 2020-09-26 NOTE — Progress Notes (Signed)
   Subjective:    Patient ID: Aaron [name], male    DOB: Feb 09, 1951, 69 y.o.   MRN: [id number]  Pt is a 69 y/o male who present for Biometric screening today. 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 He notes the [facility] department has shortages, but he is handling the stresses without problem. No new medical problem. He experienced COVID in earlier, but did not have any complications. No residual issues. He does not smoke. He has occasional alcohol beverage. He exercises weekly. No recent problems     Review of Systems  Constitutional: Negative.   HENT: Negative.   Eyes: Negative.   Cardiovascular: Negative.   Gastrointestinal: Negative.   Neurological: Negative.        Objective:   Physical Exam Constitutional:      Appearance: Normal appearance.  HENT:     Right Ear: Tympanic membrane normal.     Left Ear: Tympanic membrane normal.     Mouth/Throat:     Mouth: Mucous membranes are moist.  Eyes:     Pupils: Pupils are equal, round, and reactive to light.  Cardiovascular:     Rate and Rhythm: Normal rate and regular rhythm.  Pulmonary:     Effort: Pulmonary effort is normal.     Comments: Course breath sounds bilat. Musculoskeletal:     Cervical back: Normal range of motion.  Neurological:     General: No focal deficit present.     Mental Status: He is alert.           Assessment & Plan:

## 2020-09-27 LAB — LIPID PANEL
Chol/HDL Ratio: 3.5 ratio (ref 0.0–5.0)
Cholesterol, Total: 139 mg/dL (ref 100–199)
HDL: 40 mg/dL (ref >39)
LDL Chol Calc (NIH): 82 mg/dL (ref 0–99)
Triglycerides: 87 mg/dL (ref 0–149)
VLDL Cholesterol Cal: 17 mg/dL (ref 5–40)

## 2020-09-27 LAB — GLUCOSE, RANDOM: Glucose: 82 mg/dL (ref 65–99)

## 2020-10-02 ENCOUNTER — Encounter: Payer: Self-pay | Admitting: Physician Assistant

## 2021-01-15 ENCOUNTER — Other Ambulatory Visit: Payer: Managed Care, Other (non HMO)

## 2021-01-15 ENCOUNTER — Other Ambulatory Visit: Payer: Self-pay

## 2021-01-15 DIAGNOSIS — N138 Other obstructive and reflux uropathy: Secondary | ICD-10-CM | POA: Diagnosis not present

## 2021-01-15 DIAGNOSIS — N401 Enlarged prostate with lower urinary tract symptoms: Secondary | ICD-10-CM

## 2021-01-15 DIAGNOSIS — E291 Testicular hypofunction: Secondary | ICD-10-CM

## 2021-01-15 DIAGNOSIS — Z79899 Other long term (current) drug therapy: Secondary | ICD-10-CM

## 2021-01-17 LAB — CBC
Hematocrit: 49.3 % (ref 37.5–51.0)
Hemoglobin: 15.3 g/dL (ref 13.0–17.7)
MCH: 24.7 pg — ABNORMAL LOW (ref 26.6–33.0)
MCHC: 31 g/dL — ABNORMAL LOW (ref 31.5–35.7)
MCV: 80 fL (ref 79–97)
Platelets: 235 10*3/uL (ref 150–450)
RBC: 6.2 x10E6/uL — ABNORMAL HIGH (ref 4.14–5.80)
RDW: 15.7 % — ABNORMAL HIGH (ref 11.6–15.4)
WBC: 5.4 10*3/uL (ref 3.4–10.8)

## 2021-01-17 LAB — TESTOSTERONE,FREE AND TOTAL
Testosterone, Free: 6.3 pg/mL — ABNORMAL LOW (ref 6.6–18.1)
Testosterone: 240 ng/dL — ABNORMAL LOW (ref 264–916)

## 2021-01-17 LAB — PSA: Prostate Specific Ag, Serum: 16.2 ng/mL — ABNORMAL HIGH (ref 0.0–4.0)

## 2022-07-19 ENCOUNTER — Encounter: Payer: Self-pay | Admitting: Intensive Care

## 2022-07-19 ENCOUNTER — Emergency Department
Admission: EM | Admit: 2022-07-19 | Discharge: 2022-07-19 | Disposition: A | Discharge status: Home / Self Care | Payer: Managed Care, Other (non HMO) | Attending: Emergency Medicine | Admitting: Emergency Medicine

## 2022-07-19 ENCOUNTER — Emergency Department: Payer: Managed Care, Other (non HMO)

## 2022-07-19 ENCOUNTER — Other Ambulatory Visit: Payer: Self-pay

## 2022-07-19 DIAGNOSIS — R4182 Altered mental status, unspecified: Secondary | ICD-10-CM | POA: Diagnosis not present

## 2022-07-19 DIAGNOSIS — M5431 Sciatica, right side: Secondary | ICD-10-CM

## 2022-07-19 DIAGNOSIS — M545 Low back pain, unspecified: Secondary | ICD-10-CM | POA: Diagnosis present

## 2022-07-19 DIAGNOSIS — I10 Essential (primary) hypertension: Secondary | ICD-10-CM | POA: Insufficient documentation

## 2022-07-19 DIAGNOSIS — M5441 Lumbago with sciatica, right side: Secondary | ICD-10-CM | POA: Diagnosis not present

## 2022-07-19 DIAGNOSIS — E860 Dehydration: Secondary | ICD-10-CM | POA: Insufficient documentation

## 2022-07-19 HISTORY — DX: Benign prostatic hyperplasia without lower urinary tract symptoms: N40.0

## 2022-07-19 HISTORY — DX: Essential (primary) hypertension: I10

## 2022-07-19 LAB — COMPREHENSIVE METABOLIC PANEL
ALT: 71 U/L — ABNORMAL HIGH (ref 0–44)
AST: 61 U/L — ABNORMAL HIGH (ref 15–41)
Albumin: 4.1 g/dL (ref 3.5–5.0)
Alkaline Phosphatase: 35 U/L — ABNORMAL LOW (ref 38–126)
Anion gap: 6 (ref 5–15)
BUN: 21 mg/dL (ref 8–23)
CO2: 21 mmol/L — ABNORMAL LOW (ref 22–32)
Calcium: 8.7 mg/dL — ABNORMAL LOW (ref 8.9–10.3)
Chloride: 110 mmol/L (ref 98–111)
Creatinine, Ser: 1.15 mg/dL (ref 0.61–1.24)
GFR, Estimated: >60 mL/min (ref >60)
Glucose, Bld: 92 mg/dL (ref 70–99)
Potassium: 4.2 mmol/L (ref 3.5–5.1)
Sodium: 137 mmol/L (ref 135–145)
Total Bilirubin: 1.1 mg/dL (ref 0.3–1.2)
Total Protein: 7.4 g/dL (ref 6.5–8.1)

## 2022-07-19 LAB — URINALYSIS, ROUTINE W REFLEX MICROSCOPIC
Bacteria, UA: NONE SEEN
Bilirubin Urine: NEGATIVE
Glucose, UA: 50 mg/dL — AB
Hgb urine dipstick: NEGATIVE
Ketones, ur: 20 mg/dL — AB
Leukocytes,Ua: NEGATIVE
Nitrite: NEGATIVE
Protein, ur: 30 mg/dL — AB
Specific Gravity, Urine: 1.026 (ref 1.005–1.030)
Squamous Epithelial / HPF: NONE SEEN (ref 0–5)
pH: 5 (ref 5.0–8.0)

## 2022-07-19 LAB — CBC
HCT: 50.7 % (ref 39.0–52.0)
Hemoglobin: 16.3 g/dL (ref 13.0–17.0)
MCH: 27.9 pg (ref 26.0–34.0)
MCHC: 32.1 g/dL (ref 30.0–36.0)
MCV: 86.8 fL (ref 80.0–100.0)
Platelets: 316 10*3/uL (ref 150–400)
RBC: 5.84 MIL/uL — ABNORMAL HIGH (ref 4.22–5.81)
RDW: 14.2 % (ref 11.5–15.5)
WBC: 7.9 10*3/uL (ref 4.0–10.5)
nRBC: 0 % (ref 0.0–0.2)

## 2022-07-19 LAB — DIFFERENTIAL
Abs Immature Granulocytes: 0.05 10*3/uL (ref 0.00–0.07)
Basophils Absolute: 0.1 10*3/uL (ref 0.0–0.1)
Basophils Relative: 1 %
Eosinophils Absolute: 0.2 10*3/uL (ref 0.0–0.5)
Eosinophils Relative: 2 %
Immature Granulocytes: 1 %
Lymphocytes Relative: 12 %
Lymphs Abs: 0.9 10*3/uL (ref 0.7–4.0)
Monocytes Absolute: 0.8 10*3/uL (ref 0.1–1.0)
Monocytes Relative: 10 %
Neutro Abs: 5.9 10*3/uL (ref 1.7–7.7)
Neutrophils Relative %: 74 %

## 2022-07-19 LAB — PROTIME-INR
INR: 1.1 (ref 0.8–1.2)
Prothrombin Time: 13.6 seconds (ref 11.4–15.2)

## 2022-07-19 LAB — APTT: aPTT: 27 seconds (ref 24–36)

## 2022-07-19 MED ORDER — NAPROXEN 500 MG PO TABS: 500.0000 mg | ORAL_TABLET | Freq: Two times a day (BID) | ORAL | 0 refills | Status: AC

## 2022-07-19 NOTE — ED Provider Notes (Signed)
[facility] Provider Note    Event Date/Time   First MD Initiated Contact with Patient 07/19/22 1320     (approximate)   History   Dizziness and Altered Mental Status   HPId  OWIN VIGNOLA is a 71 y.o.   with past medical history of hypertension, bronchitis, here with dizziness and unsteady gait.  Patient primary complaint is lower back pain.  The patient works at the [employer] and wears a large belt.  He has some chronic disc issues.  He states that he has had some worsening right paraspinal lower back pain that radiates down towards his back of his leg.  This has been ongoing for last several days and he feels like it is impaired his balance.  It is unclear whether it is due to pain versus actual loss of balance.  Otherwise, patient has had some memory issues per family, but this seems to be progressively worsening over the last several months to year, although it is slightly worse in the last week since he has been in pain.  Patient denies any focal numbness or weakness.  No difficulty speaking or swallowing.  No vision changes.  No other complaints.  No recent trauma.  No loss of bowel or bladder function.     Physical Exam   Triage Vital Signs: ED Triage Vitals  Enc Vitals Group     BP 07/19/22 1202 (!) 158/101     Pulse Rate 07/19/22 1202 93     Resp 07/19/22 1202 16     Temp 07/19/22 1202 97.8 F (36.6 C)     Temp Source 07/19/22 1202 Oral     SpO2 07/19/22 1202 98 %     Weight 07/19/22 1149 186 lb (84.4 kg)     Height 07/19/22 1149 '5\' 4"'$  (1.626 m)     Head Circumference --      Peak Flow --      Pain Score 07/19/22 1149 0     Pain Loc --      Pain Edu? --      Excl. in [place]? --     Most recent vital signs: Vitals:   07/19/22 1345 07/19/22 1537  BP: (!) 150/82 (!) 145/88  Pulse:  89  Resp:  18  Temp:  98.2 F (36.8 C)  SpO2:  98%     General: Awake, no distress.  CV:  Good peripheral perfusion.  Regular rate and rhythm. Resp:  Normal  effort.  Lungs clear. Abd:  No distention.  No tenderness. Other:  Moderate right paraspinal tenderness over the sacral spine and the sciatic nerve area.  No midline tenderness.  Strength 5 out of 5 bilateral lower extremities.  Quad reflexes 2+ and symmetric.  Neurological Exam:  Mental Status: Alert and oriented to person, place, and time. Attention and concentration normal. Speech clear. Recent memory is intact. Cranial Nerves: Visual fields grossly intact. EOMI and PERRLA. No nystagmus noted. Facial sensation intact at forehead, maxillary cheek, and chin/mandible bilaterally. No facial asymmetry or weakness. Hearing grossly normal. Uvula is midline, and palate elevates symmetrically. Normal SCM and trapezius strength. Tongue midline without fasciculations. Motor: Muscle strength 5/5 in proximal and distal UE and LE bilaterally. No pronator drift. Muscle tone normal. Sensation: Intact to light touch in upper and lower extremities distally bilaterally.  Gait: Normal Coordination: Normal FTN bilaterally.     ED Results / Procedures / Treatments   Labs (all labs ordered are listed, but only abnormal results  are displayed) Labs Reviewed  CBC - Abnormal; Notable for the following components:      Result Value   RBC 5.84 (*)    All other components within normal limits  COMPREHENSIVE METABOLIC PANEL - Abnormal; Notable for the following components:   CO2 21 (*)    Calcium 8.7 (*)    AST 61 (*)    ALT 71 (*)    Alkaline Phosphatase 35 (*)    All other components within normal limits  URINALYSIS, ROUTINE W REFLEX MICROSCOPIC - Abnormal; Notable for the following components:   Color, Urine YELLOW (*)    APPearance CLEAR (*)    Glucose, UA 50 (*)    Ketones, ur 20 (*)    Protein, ur 30 (*)    All other components within normal limits  PROTIME-INR  APTT  DIFFERENTIAL     EKG Normal sinus rhythm, ventricular at 93.  PR 160, QRS 70, QTc 3 5.  No acute ST elevations or depressions.   EKG evidence of acute ischemia or infarct.   RADIOLOGY CT head: No acute intracranial abnormality   I also independently reviewed and agree with radiologist interpretations.   PROCEDURES:  Critical Care performed: No   MEDICATIONS ORDERED IN ED: Medications - No data to display   IMPRESSION / MDM / [place] / ED COURSE  I reviewed the triage vital signs and the nursing notes.                               Ddx:  Differential includes the following, with pertinent life- or limb-threatening emergencies considered:  Sciatica with subsequent gait abnormality due to pain/balance issues related to the radiculopathy, stroke, peripheral neuropathy, TIA, intracranial brain/mass lesion, generalized medical condition causing gait disturbance such as anemia, infection, AKI  Patient's presentation is most consistent with acute presentation with potential threat to life or bodily function.  MDM:  70 yo M with PMHx BPH, HTN, here with multiple complaints. Primary complaint is paraspinal lower back pain radiating down his leg, which seems to be causing balance issues. Clinically, suspect sciatica with reproducible tenderness. Distal strength and sensation is intact. No midline tenderness. No cauda equina sx or red flags. No fever, chills, or sx of osteo/diskitis. No h/o prior surgeries. Pain reproducible on palpation and with leg raise on R.   Suspect pt's pain has possibly exacerbated mild underlying cognitive impairment. In speaking with daughter, pt has had progressively worsening memory issues for over a year. This has gotten worse more recently but seems to be an ongoing issue. Pt is alert and oriented here but suspect pain 2/2 sciatica may be causing some mild delirium with underlying early dementia, and pt has an extensive family h/o same. CT head shows no acute abnormalities. He has no focal neurological deficits. His reported loss of balance was more so due to his back  pain/imbalance and he has no dysmetria, dysarthria or signs of cerebellar abnormality on exam. No OTC med use. UA negative for UTI. Labs reassuring. No uremia or other abnormality.   Discussed reassuring labs, work-up with pt and daughter. Pt states his main issue is his back pain. Will advise scheduled NSAIDs to help with pain from likely radiculopathy, but also advise him to f/u with his PCP for further cognitive testing and further work-up. Return precautions given.    MEDICATIONS GIVEN IN ED: Medications - No data to display   Consults:  None  EMR reviewed       FINAL CLINICAL IMPRESSION(S) / ED DIAGNOSES   Final diagnoses:  Dehydration  Sciatica of right side     Rx / DC Orders   ED Discharge Orders          Ordered    naproxen (NAPROSYN) 500 MG tablet  2 times daily with meals        07/19/22 1504             Note:  This document was prepared using Dragon voice recognition software and may include unintentional dictation errors.   Duffy Bruce, MD 07/19/22 2049

## 2022-07-19 NOTE — ED Triage Notes (Signed)
Patient c/o dizziness and unsteady gait that was worse yesterday than today. Sent home from work today for AMS and unsteady gait. Reports fatigue lately. Patient and family reports trouble gaining words the past week. Denies N/V.

## 2023-08-27 ENCOUNTER — Ambulatory Visit: Payer: Medicare Other | Admitting: Urology

## 2023-10-21 ENCOUNTER — Ambulatory Visit: Payer: Self-pay | Admitting: General Surgery

## 2023-10-21 NOTE — H&P (View-Only) (Signed)
 PATIENT PROFILE: Aaron [name] is a 72 y.o. male who presents to the Clinic for evaluation of umbilical hernia.  PCP:  Aaron Buddy, NP  HISTORY OF PRESENT ILLNESS: Aaron [name] reports was previously evaluated in August due to symptomatic umbilical hernia.  At the time we have discussed about the implication of having an umbilical hernia.  He was minimally symptomatic with intermittent pain.  After discussion of elective umbilical hernia repair patient decided to think about it.  Patient today comes with concern of recurrent pain in the periumbilical area.  Pain aggravated by twisting the abdominal wall.  Elevating factor is resting.  No pain radiation.  He thought about it but that due to his recurrent pain he would like to proceed with umbilical hernia repair.   PROBLEM LIST: Problem List  Date Reviewed: 07/23/2023          Noted   Elevated LFTs 10/30/2018   Hyperlipidemia, mixed 10/20/2018   Obesity (BMI 30-39.9), unspecified Unknown   Hypertension Unknown   Vitamin D insufficiency Unknown   Elevated PSA, between 10 and less than 20 ng/ml Unknown   Overview    w/o evidence of cancer      ED (erectile dysfunction) Unknown   Chronic sinusitis with recurrent bronchitis 11/30/2016   Overview    Being evaluated by [facility]; possible allergy trigger.       GENERAL REVIEW OF SYSTEMS:   General ROS: negative for - chills, fatigue, fever, weight gain or weight loss Allergy and Immunology ROS: negative for - hives  Hematological and Lymphatic ROS: negative for - bleeding problems or bruising, negative for palpable nodes Endocrine ROS: negative for - heat or cold intolerance, hair changes Respiratory ROS: negative for - cough, shortness of breath or wheezing Cardiovascular ROS: no chest pain or palpitations GI ROS: negative for nausea, vomiting, abdominal pain, diarrhea, constipation Musculoskeletal ROS: negative for - joint swelling or muscle pain Neurological  ROS: negative for - confusion, syncope Dermatological ROS: negative for pruritus and rash Psychiatric: negative for anxiety, depression, difficulty sleeping and memory loss  MEDICATIONS: Current Outpatient Medications  Medication Sig Dispense Refill   albuterol 90 mcg/actuation inhaler Inhale 2 inhalations into the lungs every 6 (six) hours as needed for Wheezing     amLODIPine (NORVASC) 10 MG tablet Take 1 tablet (10 mg total) by mouth once daily 90 tablet 1   fluticasone propionate (FLONASE) 50 mcg/actuation nasal spray Place 1 spray into both nostrils once daily (Patient taking differently: Place 1 spray into both nostrils once daily as needed) 48 g 1   Lactobacillus acidophilus (PROBIOTIC ORAL) Take 1 tablet by mouth once daily     losartan (COZAAR) 100 MG tablet take one tablet by mouth one time daily 90 tablet 1   metoprolol succinate (TOPROL-XL) 25 MG XL tablet Take 1 tablet (25 mg total) by mouth once daily 90 tablet 1   MULTIVITAMIN ORAL Take 1 tablet by mouth once daily        omeprazole (PRILOSEC) 20 MG DR capsule Take 1 capsule (20 mg total) by mouth once daily 90 capsule 1   rosuvastatin (CRESTOR) 5 MG tablet Take 1 tablet (5 mg total) by mouth once daily 90 tablet 1   sildenafil (REVATIO) 20 mg tablet Take 1-3 tablets (20-60 mg total) by mouth once daily as needed (ED) 30 tablet 5   testosterone (ANDROGEL) 20.25 mg/1.25 gram (1.62 %) gel in metered dose pump Apply 2 Pump topically once daily    (  Patient not taking: Reported on 10/21/2023)     No current facility-administered medications for this visit.    ALLERGIES: Patient has no known allergies.  PAST MEDICAL HISTORY: Past Medical History:  Diagnosis Date   BPH (benign prostatic hyperplasia)    s/p laser ablation by Dr. Artis Flock.   Chronic sinusitis with recurrent bronchitis 2018   Being evaluated by [facility]; possible allergy trigger.   COVID-19 12/13/2019   COVID-19 06/30/2021   ED (erectile  dysfunction)    Elevated LFTs 10/2018   Elevated PSA, between 10 and less than 20 ng/ml    w/o evidence of cancer   Hypertension    Obesity (BMI 30-39.9), unspecified    Vitamin D insufficiency     PAST SURGICAL HISTORY: Past Surgical History:  Procedure Laterality Date   LASER VAPORIZATION PROSTATE  2015   Dr. Artis Flock due to severity of BPH   COLONOSCOPY  07/11/2018   Tubular adenoma of the colon/Repeat 30yrs/TKT   BIOPSY PROSTATE NEEDLE/PUNCH     Negative bx in 2005 & 2015.   TONSILLECTOMY  Age 78     FAMILY HISTORY: Family History  Problem Relation Name Age of Onset   Dementia Mother     Diabetes type II Paternal Grandmother     Sudden cardiac death Father  59     SOCIAL HISTORY: Social History   Socioeconomic History   Marital status: Single    Spouse name: Aaron [name] ([place])   Number of children: 1  Occupational History   Occupation: Biochemist, clinical    Comment: [employer]  Tobacco Use   Smoking status: Never   Smokeless tobacco: Never  Vaping Use   Vaping status: Never Used  Substance and Sexual Activity   Alcohol use: Yes    Alcohol/week: 1.0 standard drink of alcohol    Types: 1 Cans of beer per week   Drug use: Never   Sexual activity: Yes    Partners: Female    Birth control/protection: None    PHYSICAL EXAM: Vitals:   10/21/23 0810  BP: (!) 174/118  Pulse: 73   Body mass index is 31.62 kg/m. Weight: 86.2 kg (190 lb)   GENERAL: Alert, active, oriented x3  HEENT: Pupils equal reactive to light. Extraocular movements are intact. Sclera clear. Palpebral conjunctiva normal red color.Pharynx clear.  NECK: Supple with no palpable mass and no adenopathy.  LUNGS: Sound clear with no rales rhonchi or wheezes.  HEART: Regular rhythm S1 and S2 without murmur.  ABDOMEN: Soft and depressible, nontender with no palpable mass, no hepatomegaly.   EXTREMITIES: Well-developed well-nourished symmetrical with no dependent edema.  NEUROLOGICAL: Awake alert  oriented, facial expression symmetrical, moving all extremities.  REVIEW OF DATA: I have reviewed the following data today: Office Visit on 07/23/2023  Component Date Value   WBC (White Blood Cell Co* 07/23/2023 7.3    RBC (Red Blood Cell Coun* 07/23/2023 5.87    Hemoglobin 07/23/2023 17.9    Hematocrit 07/23/2023 54.0 (H)    MCV (Mean Corpuscular Vo* 07/23/2023 92.0    MCH (Mean Corpuscular He* 07/23/2023 30.5    MCHC (Mean Corpuscular H* 07/23/2023 33.1    Platelet Count 07/23/2023 217    RDW-CV (Red Cell Distrib* 07/23/2023 15.2 (H)    MPV (Mean Platelet Volum* 07/23/2023 10.1    Neutrophils 07/23/2023 5.30    Lymphocytes 07/23/2023 1.00    Mixed Count 07/23/2023 1.00 (H)    Neutrophil % 07/23/2023 72.5 (H)    Lymphocyte %  07/23/2023 14.2    Mixed % 07/23/2023 13.3    Glucose 07/23/2023 86    Sodium 07/23/2023 139    Potassium 07/23/2023 4.9    Chloride 07/23/2023 103    Carbon Dioxide (CO2) 07/23/2023 27.3    Urea Nitrogen (BUN) 07/23/2023 16    Creatinine 07/23/2023 1.2    Glomerular Filtration Ra* 07/23/2023 65    Calcium 07/23/2023 9.5    AST  07/23/2023 31    ALT  07/23/2023 48    Alk Phos (alkaline Phosp* 07/23/2023 55    Albumin 07/23/2023 4.3    Bilirubin, Total 07/23/2023 0.6    Protein, Total 07/23/2023 6.7    A/G Ratio 07/23/2023 1.8    Hemoglobin A1C 07/23/2023 6.0 (H)    Average Blood Glucose (C* 07/23/2023 126    Cholesterol, Total 07/23/2023 148    Triglyceride 07/23/2023 54    HDL (High Density Lipopr* 44/11/270 25.6 (L)    LDL Calculated 07/23/2023 536    VLDL Cholesterol 07/23/2023 11    Cholesterol/HDL Ratio 07/23/2023 5.8    Thyroid Stimulating Horm* 07/23/2023 2.138    Creatinine, Random Urine 07/23/2023 106.3    Urine Albumin, Random 07/23/2023 37    Urine Albumin/Creatinine* 07/23/2023 34.8 (H)    Vitamin B12 07/23/2023 400      ASSESSMENT: Mr. Axelson is a 72 y.o. male presenting for consultation for umbilical hernia.    The patient  presents for reevaluation of symptomatic umbilical hernia.  Patient had another episode of pain in the paramedical area.  He endorsed that the pain was symptomatic.  He was aggravated by twisting the abdominal wall.  Patient was oriented again about the diagnosis of umbilical hernia and its implication. The patient was oriented about the treatment alternatives (observation vs surgical repair). Due to patient symptoms, repair is recommended. Patient oriented about the surgical procedure, the use of mesh and its risk of complications such as: infection, bleeding, injury to vasculature, injury to bowel or bladder, and chronic pain, intestinal obstruction, among others.   Umbilical hernia without obstruction and without gangrene [K42.9]  PLAN: 1.  Robotic assisted laparoscopic umbilical hernia repair (64403) 2.  Avoid taking aspirin or any blood thinners 5 days before the procedure 3.  Contact us if you have any question or concern  Patient verbalized understanding, all questions were answered, and were agreeable with the plan outlined above.    Carolan Shiver, MD  Electronically signed by Carolan Shiver, MD

## 2023-10-21 NOTE — H&P (Signed)
PATIENT PROFILE: Aaron [name] is a 72 y.o. male who presents to the Clinic for evaluation of umbilical hernia.  PCP:  Myrene Buddy, NP  HISTORY OF PRESENT ILLNESS: Aaron [name] reports was previously evaluated in August due to symptomatic umbilical hernia.  At the time we have discussed about the implication of having an umbilical hernia.  He was minimally symptomatic with intermittent pain.  After discussion of elective umbilical hernia repair patient decided to think about it.  Patient today comes with concern of recurrent pain in the periumbilical area.  Pain aggravated by twisting the abdominal wall.  Elevating factor is resting.  No pain radiation.  He thought about it but that due to his recurrent pain he would like to proceed with umbilical hernia repair.   PROBLEM LIST: Problem List  Date Reviewed: 07/23/2023          Noted   Elevated LFTs 10/30/2018   Hyperlipidemia, mixed 10/20/2018   Obesity (BMI 30-39.9), unspecified Unknown   Hypertension Unknown   Vitamin D insufficiency Unknown   Elevated PSA, between 10 and less than 20 ng/ml Unknown   Overview    w/o evidence of cancer      ED (erectile dysfunction) Unknown   Chronic sinusitis with recurrent bronchitis 11/30/2016   Overview    Being evaluated by [facility]; possible allergy trigger.       GENERAL REVIEW OF SYSTEMS:   General ROS: negative for - chills, fatigue, fever, weight gain or weight loss Allergy and Immunology ROS: negative for - hives  Hematological and Lymphatic ROS: negative for - bleeding problems or bruising, negative for palpable nodes Endocrine ROS: negative for - heat or cold intolerance, hair changes Respiratory ROS: negative for - cough, shortness of breath or wheezing Cardiovascular ROS: no chest pain or palpitations GI ROS: negative for nausea, vomiting, abdominal pain, diarrhea, constipation Musculoskeletal ROS: negative for - joint swelling or muscle pain Neurological  ROS: negative for - confusion, syncope Dermatological ROS: negative for pruritus and rash Psychiatric: negative for anxiety, depression, difficulty sleeping and memory loss  MEDICATIONS: Current Outpatient Medications  Medication Sig Dispense Refill   albuterol 90 mcg/actuation inhaler Inhale 2 inhalations into the lungs every 6 (six) hours as needed for Wheezing     amLODIPine (NORVASC) 10 MG tablet Take 1 tablet (10 mg total) by mouth once daily 90 tablet 1   fluticasone propionate (FLONASE) 50 mcg/actuation nasal spray Place 1 spray into both nostrils once daily (Patient taking differently: Place 1 spray into both nostrils once daily as needed) 48 g 1   Lactobacillus acidophilus (PROBIOTIC ORAL) Take 1 tablet by mouth once daily     losartan (COZAAR) 100 MG tablet take one tablet by mouth one time daily 90 tablet 1   metoprolol succinate (TOPROL-XL) 25 MG XL tablet Take 1 tablet (25 mg total) by mouth once daily 90 tablet 1   MULTIVITAMIN ORAL Take 1 tablet by mouth once daily        omeprazole (PRILOSEC) 20 MG DR capsule Take 1 capsule (20 mg total) by mouth once daily 90 capsule 1   rosuvastatin (CRESTOR) 5 MG tablet Take 1 tablet (5 mg total) by mouth once daily 90 tablet 1   sildenafil (REVATIO) 20 mg tablet Take 1-3 tablets (20-60 mg total) by mouth once daily as needed (ED) 30 tablet 5   testosterone (ANDROGEL) 20.25 mg/1.25 gram (1.62 %) gel in metered dose pump Apply 2 Pump topically once daily    (  Patient not taking: Reported on 10/21/2023)     No current facility-administered medications for this visit.    ALLERGIES: Patient has no known allergies.  PAST MEDICAL HISTORY: Past Medical History:  Diagnosis Date   BPH (benign prostatic hyperplasia)    s/p laser ablation by Dr. Artis Flock.   Chronic sinusitis with recurrent bronchitis 2018   Being evaluated by [facility]; possible allergy trigger.   COVID-19 12/13/2019   COVID-19 06/30/2021   ED (erectile  dysfunction)    Elevated LFTs 10/2018   Elevated PSA, between 10 and less than 20 ng/ml    w/o evidence of cancer   Hypertension    Obesity (BMI 30-39.9), unspecified    Vitamin D insufficiency     PAST SURGICAL HISTORY: Past Surgical History:  Procedure Laterality Date   LASER VAPORIZATION PROSTATE  2015   Dr. Artis Flock due to severity of BPH   COLONOSCOPY  07/11/2018   Tubular adenoma of the colon/Repeat 30yrs/TKT   BIOPSY PROSTATE NEEDLE/PUNCH     Negative bx in 2005 & 2015.   TONSILLECTOMY  Age 78     FAMILY HISTORY: Family History  Problem Relation Name Age of Onset   Dementia Mother     Diabetes type II Paternal Grandmother     Sudden cardiac death Father  59     SOCIAL HISTORY: Social History   Socioeconomic History   Marital status: Single    Spouse name: Mamie Nick ([place])   Number of children: 1  Occupational History   Occupation: Biochemist, clinical    Comment: [employer]  Tobacco Use   Smoking status: Never   Smokeless tobacco: Never  Vaping Use   Vaping status: Never Used  Substance and Sexual Activity   Alcohol use: Yes    Alcohol/week: 1.0 standard drink of alcohol    Types: 1 Cans of beer per week   Drug use: Never   Sexual activity: Yes    Partners: Female    Birth control/protection: None    PHYSICAL EXAM: Vitals:   10/21/23 0810  BP: (!) 174/118  Pulse: 73   Body mass index is 31.62 kg/m. Weight: 86.2 kg (190 lb)   GENERAL: Alert, active, oriented x3  HEENT: Pupils equal reactive to light. Extraocular movements are intact. Sclera clear. Palpebral conjunctiva normal red color.Pharynx clear.  NECK: Supple with no palpable mass and no adenopathy.  LUNGS: Sound clear with no rales rhonchi or wheezes.  HEART: Regular rhythm S1 and S2 without murmur.  ABDOMEN: Soft and depressible, nontender with no palpable mass, no hepatomegaly.   EXTREMITIES: Well-developed well-nourished symmetrical with no dependent edema.  NEUROLOGICAL: Awake alert  oriented, facial expression symmetrical, moving all extremities.  REVIEW OF DATA: I have reviewed the following data today: Office Visit on 07/23/2023  Component Date Value   WBC (White Blood Cell Co* 07/23/2023 7.3    RBC (Red Blood Cell Coun* 07/23/2023 5.87    Hemoglobin 07/23/2023 17.9    Hematocrit 07/23/2023 54.0 (H)    MCV (Mean Corpuscular Vo* 07/23/2023 92.0    MCH (Mean Corpuscular He* 07/23/2023 30.5    MCHC (Mean Corpuscular H* 07/23/2023 33.1    Platelet Count 07/23/2023 217    RDW-CV (Red Cell Distrib* 07/23/2023 15.2 (H)    MPV (Mean Platelet Volum* 07/23/2023 10.1    Neutrophils 07/23/2023 5.30    Lymphocytes 07/23/2023 1.00    Mixed Count 07/23/2023 1.00 (H)    Neutrophil % 07/23/2023 72.5 (H)    Lymphocyte %  07/23/2023 14.2    Mixed % 07/23/2023 13.3    Glucose 07/23/2023 86    Sodium 07/23/2023 139    Potassium 07/23/2023 4.9    Chloride 07/23/2023 103    Carbon Dioxide (CO2) 07/23/2023 27.3    Urea Nitrogen (BUN) 07/23/2023 16    Creatinine 07/23/2023 1.2    Glomerular Filtration Ra* 07/23/2023 65    Calcium 07/23/2023 9.5    AST  07/23/2023 31    ALT  07/23/2023 48    Alk Phos (alkaline Phosp* 07/23/2023 55    Albumin 07/23/2023 4.3    Bilirubin, Total 07/23/2023 0.6    Protein, Total 07/23/2023 6.7    A/G Ratio 07/23/2023 1.8    Hemoglobin A1C 07/23/2023 6.0 (H)    Average Blood Glucose (C* 07/23/2023 126    Cholesterol, Total 07/23/2023 148    Triglyceride 07/23/2023 54    HDL (High Density Lipopr* 44/11/270 25.6 (L)    LDL Calculated 07/23/2023 536    VLDL Cholesterol 07/23/2023 11    Cholesterol/HDL Ratio 07/23/2023 5.8    Thyroid Stimulating Horm* 07/23/2023 2.138    Creatinine, Random Urine 07/23/2023 106.3    Urine Albumin, Random 07/23/2023 37    Urine Albumin/Creatinine* 07/23/2023 34.8 (H)    Vitamin B12 07/23/2023 400      ASSESSMENT: Aaron [name] is a 72 y.o. male presenting for consultation for umbilical hernia.    The patient  presents for reevaluation of symptomatic umbilical hernia.  Patient had another episode of pain in the paramedical area.  He endorsed that the pain was symptomatic.  He was aggravated by twisting the abdominal wall.  Patient was oriented again about the diagnosis of umbilical hernia and its implication. The patient was oriented about the treatment alternatives (observation vs surgical repair). Due to patient symptoms, repair is recommended. Patient oriented about the surgical procedure, the use of mesh and its risk of complications such as: infection, bleeding, injury to vasculature, injury to bowel or bladder, and chronic pain, intestinal obstruction, among others.   Umbilical hernia without obstruction and without gangrene [K42.9]  PLAN: 1.  Robotic assisted laparoscopic umbilical hernia repair (64403) 2.  Avoid taking aspirin or any blood thinners 5 days before the procedure 3.  Contact us if you have any question or concern  Patient verbalized understanding, all questions were answered, and were agreeable with the plan outlined above.    Carolan Shiver, MD  Electronically signed by Carolan Shiver, MD

## 2023-11-11 ENCOUNTER — Encounter
Admission: RE | Admit: 2023-11-11 | Discharge: 2023-11-11 | Disposition: A | Discharge status: Home / Self Care | Payer: Medicare Other | Source: Ambulatory Visit | Attending: General Surgery | Admitting: General Surgery

## 2023-11-11 ENCOUNTER — Other Ambulatory Visit: Payer: Self-pay

## 2023-11-11 VITALS — Ht 65.0 in | Wt 184.0 lb

## 2023-11-11 DIAGNOSIS — I10 Essential (primary) hypertension: Secondary | ICD-10-CM

## 2023-11-11 DIAGNOSIS — R7303 Prediabetes: Secondary | ICD-10-CM

## 2023-11-11 NOTE — Patient Instructions (Addendum)
Your procedure is scheduled on: Friday 11/19/23 To find out your arrival time, please call [phone] between 1PM - 3PM on:   Thursday 11/18/23 Report to the Registration Desk on the 1st floor of the [facility]. FREE Valet parking is available.  If your arrival time is 6:00 am, do not arrive before that time as the [facility] entrance doors do not open until 6:00 am.  REMEMBER: Instructions that are not followed completely may result in serious medical risk, up to and including death; or upon the discretion of your surgeon and anesthesiologist your surgery may need to be rescheduled.  Do not eat food or drink any liquids after midnight the night before surgery.  No gum chewing or hard candies.  One week prior to surgery: Stop Anti-inflammatories (NSAIDS) such as Advil, Aleve, Ibuprofen, Motrin, Naproxen, Naprosyn and Aspirin based products such as Excedrin, Goody's Powder, BC Powder. You may however, continue to take Tylenol if needed for pain up until the day of surgery.  Stop ANY OVER THE COUNTER supplements and vitamins until after surgery.  Continue taking all prescribed medications.    TAKE ONLY THESE MEDICATIONS THE MORNING OF SURGERY WITH A SIP OF WATER:  amLODipine (NORVASC) 10 MG tablet  metoprolol succinate (TOPROL-XL) 25 MG 24 hr tablet  omeprazole (PRILOSEC) 20 MG capsule Antacid (take one the night before and one on the morning of surgery - helps to prevent nausea after surgery.) rosuvastatin (CRESTOR) 5 MG tablet   No Alcohol for 24 hours before or after surgery.  No Smoking including e-cigarettes for 24 hours before surgery.  No chewable tobacco products for at least 6 hours before surgery.  No nicotine patches on the day of surgery.  Do not use any "recreational" drugs for at least a week (preferably 2 weeks) before your surgery.  Please be advised that the combination of cocaine and anesthesia may have negative outcomes, up to and including death. If you  test positive for cocaine, your surgery will be cancelled.  On the morning of surgery brush your teeth with toothpaste and water, you may rinse your mouth with mouthwash if you wish. Do not swallow any toothpaste or mouthwash.  Use CHG Soap or wipes as directed on instruction sheet.  Do not wear lotions, powders, or perfumes.   Do not shave body hair from the neck down 48 hours before surgery.  Wear clean comfortable clothing (specific to your surgery type) to the hospital.  Do not wear jewelry, make-up, hairpins, clips or nail polish.  For welded (permanent) jewelry: bracelets, anklets, waist bands, etc.  Please have this removed prior to surgery.  If it is not removed, there is a chance that hospital personnel will need to cut it off on the day of surgery. Contact lenses, hearing aids and dentures may not be worn into surgery.  Do not bring valuables to the hospital. [facility] is not responsible for any missing/lost belongings or valuables.   Notify your doctor if there is any change in your medical condition (cold, fever, infection).  If you are being discharged the day of surgery, you will not be allowed to drive home. You will need a responsible individual to drive you home and stay with you for 24 hours after surgery.   If you are taking public transportation, you will need to have a responsible individual with you.  If you are being admitted to the hospital overnight, leave your suitcase in the car. After surgery it may be brought to  your room.  In case of increased patient census, it may be necessary for you, the patient, to continue your postoperative care in the Same Day Surgery department.  After surgery, you can help prevent lung complications by doing breathing exercises.  Take deep breaths and cough every 1-2 hours. Your doctor may order a device called an Incentive Spirometer to help you take deep breaths. When coughing or sneezing, hold a pillow firmly against your  incision with both hands. This is called "splinting." Doing this helps protect your incision. It also decreases belly discomfort.  Surgery Visitation Policy:  Patients undergoing a surgery or procedure may have two family members or support persons with them as long as the person is not COVID-19 positive or experiencing its symptoms.   Inpatient Visitation:    Visiting hours are 7 a.m. to 8 p.m. Up to four visitors are allowed at one time in a patient room. The visitors may rotate out with other people during the day. One designated support person (adult) may remain overnight.  Please call the Pre-admissions Testing Dept. at [phone] if you have any questions about these instructions.     Preparing for Surgery with CHLORHEXIDINE GLUCONATE (CHG) Soap  Chlorhexidine Gluconate (CHG) Soap  o An antiseptic cleaner that kills germs and bonds with the skin to continue killing germs even after washing  o Used for showering the night before surgery and morning of surgery  Before surgery, you can play an important role by reducing the number of germs on your skin.  CHG (Chlorhexidine gluconate) soap is an antiseptic cleanser which kills germs and bonds with the skin to continue killing germs even after washing.  Please do not use if you have an allergy to CHG or antibacterial soaps. If your skin becomes reddened/irritated stop using the CHG.  1. Shower the NIGHT BEFORE SURGERY and the MORNING OF SURGERY with CHG soap.  2. If you choose to wash your hair, wash your hair first as usual with your normal shampoo.  3. After shampooing, rinse your hair and body thoroughly to remove the shampoo.  4. Use CHG as you would any other liquid soap. You can apply CHG directly to the skin and wash gently with a scrungie or a clean washcloth.  5. Apply the CHG soap to your body only from the neck down. Do not use on open wounds or open sores. Avoid contact with your eyes, ears, mouth, and genitals  (private parts). Wash face and genitals (private parts) with your normal soap.  6. Wash thoroughly, paying special attention to the area where your surgery will be performed.  7. Thoroughly rinse your body with warm water.  8. Do not shower/wash with your normal soap after using and rinsing off the CHG soap.  9. Pat yourself dry with a clean towel.  10. Wear clean pajamas to bed the night before surgery.  12. Place clean sheets on your bed the night of your first shower and do not sleep with pets.  13. Shower again with the CHG soap on the day of surgery prior to arriving at the hospital.  14. Do not apply any deodorants/lotions/powders.  15. Please wear clean clothes to the hospital.

## 2023-11-12 ENCOUNTER — Encounter: Payer: Self-pay | Admitting: Urgent Care

## 2023-11-12 ENCOUNTER — Encounter
Admission: RE | Admit: 2023-11-12 | Discharge: 2023-11-12 | Disposition: A | Discharge status: Home / Self Care | Payer: Medicare Other | Source: Ambulatory Visit | Attending: General Surgery | Admitting: General Surgery

## 2023-11-12 DIAGNOSIS — Z01818 Encounter for other preprocedural examination: Secondary | ICD-10-CM | POA: Diagnosis present

## 2023-11-12 DIAGNOSIS — I10 Essential (primary) hypertension: Secondary | ICD-10-CM | POA: Insufficient documentation

## 2023-11-12 DIAGNOSIS — R7303 Prediabetes: Secondary | ICD-10-CM | POA: Diagnosis not present

## 2023-11-12 LAB — CBC
HCT: 55.1 % — ABNORMAL HIGH (ref 39.0–52.0)
Hemoglobin: 18.8 g/dL — ABNORMAL HIGH (ref 13.0–17.0)
MCH: 31 pg (ref 26.0–34.0)
MCHC: 34.1 g/dL (ref 30.0–36.0)
MCV: 90.9 fL (ref 80.0–100.0)
Platelets: 171 10*3/uL (ref 150–400)
RBC: 6.06 MIL/uL — ABNORMAL HIGH (ref 4.22–5.81)
RDW: 12.9 % (ref 11.5–15.5)
WBC: 6.9 10*3/uL (ref 4.0–10.5)
nRBC: 0 % (ref 0.0–0.2)

## 2023-11-12 LAB — BASIC METABOLIC PANEL
Anion gap: 6 (ref 5–15)
BUN: 27 mg/dL — ABNORMAL HIGH (ref 8–23)
CO2: 26 mmol/L (ref 22–32)
Calcium: 8.5 mg/dL — ABNORMAL LOW (ref 8.9–10.3)
Chloride: 102 mmol/L (ref 98–111)
Creatinine, Ser: 1.12 mg/dL (ref 0.61–1.24)
GFR, Estimated: >60 mL/min (ref >60)
Glucose, Bld: 101 mg/dL — ABNORMAL HIGH (ref 70–99)
Potassium: 3.7 mmol/L (ref 3.5–5.1)
Sodium: 134 mmol/L — ABNORMAL LOW (ref 135–145)

## 2023-11-18 MED ORDER — CHLORHEXIDINE GLUCONATE 0.12 % MT SOLN
15.0000 mL | Freq: Once | OROMUCOSAL | Status: AC
  Administered 2023-11-19: 15 mL via OROMUCOSAL

## 2023-11-18 MED ORDER — LACTATED RINGERS IV SOLN: INTRAVENOUS | Status: DC

## 2023-11-18 MED ORDER — ORAL CARE MOUTH RINSE: 15.0000 mL | Freq: Once | OROMUCOSAL | Status: AC

## 2023-11-18 MED ORDER — CEFAZOLIN SODIUM-DEXTROSE 2-4 GM/100ML-% IV SOLN
2.0000 g | INTRAVENOUS | Status: AC
  Administered 2023-11-19: 2 g via INTRAVENOUS

## 2023-11-19 ENCOUNTER — Other Ambulatory Visit: Payer: Self-pay

## 2023-11-19 ENCOUNTER — Encounter: Payer: Self-pay | Admitting: General Surgery

## 2023-11-19 ENCOUNTER — Ambulatory Visit
Admission: RE | Admit: 2023-11-19 | Discharge: 2023-11-19 | Disposition: A | Discharge status: Home / Self Care | Payer: Medicare Other | Attending: General Surgery | Admitting: General Surgery

## 2023-11-19 ENCOUNTER — Ambulatory Visit: Payer: Medicare Other | Admitting: Anesthesiology

## 2023-11-19 ENCOUNTER — Ambulatory Visit: Payer: Medicare Other | Admitting: Urgent Care

## 2023-11-19 ENCOUNTER — Encounter
Admission: RE | Disposition: A | Discharge status: Home / Self Care | Payer: Self-pay | Source: Home / Self Care | Attending: General Surgery

## 2023-11-19 DIAGNOSIS — K219 Gastro-esophageal reflux disease without esophagitis: Secondary | ICD-10-CM | POA: Insufficient documentation

## 2023-11-19 DIAGNOSIS — I10 Essential (primary) hypertension: Secondary | ICD-10-CM | POA: Diagnosis not present

## 2023-11-19 DIAGNOSIS — K420 Umbilical hernia with obstruction, without gangrene: Secondary | ICD-10-CM | POA: Insufficient documentation

## 2023-11-19 HISTORY — PX: INSERTION OF MESH: SHX5868

## 2023-11-19 SURGERY — REPAIR, HERNIA, UMBILICAL, ROBOT-ASSISTED
Anesthesia: General | Site: Abdomen

## 2023-11-19 MED ORDER — FENTANYL CITRATE (PF) 100 MCG/2ML IJ SOLN
INTRAMUSCULAR | Status: DC | PRN
  Administered 2023-11-19 (×2): 50 ug via INTRAVENOUS

## 2023-11-19 MED ORDER — OXYCODONE HCL 5 MG/5ML PO SOLN: 5.0000 mg | Freq: Once | ORAL | Status: AC | PRN

## 2023-11-19 MED ORDER — PROPOFOL 10 MG/ML IV BOLUS
INTRAVENOUS | Status: DC | PRN
  Administered 2023-11-19: 130 mg via INTRAVENOUS

## 2023-11-19 MED ORDER — MIDAZOLAM HCL 2 MG/2ML IJ SOLN
INTRAMUSCULAR | Status: AC
  Filled 2023-11-19: qty 2

## 2023-11-19 MED ORDER — PROPOFOL 10 MG/ML IV BOLUS
INTRAVENOUS | Status: AC
  Filled 2023-11-19: qty 20

## 2023-11-19 MED ORDER — ONDANSETRON HCL 4 MG/2ML IJ SOLN
INTRAMUSCULAR | Status: DC | PRN
  Administered 2023-11-19: 4 mg via INTRAVENOUS

## 2023-11-19 MED ORDER — CEFAZOLIN SODIUM-DEXTROSE 2-4 GM/100ML-% IV SOLN
INTRAVENOUS | Status: AC
  Filled 2023-11-19: qty 100

## 2023-11-19 MED ORDER — MIDAZOLAM HCL 2 MG/2ML IJ SOLN
INTRAMUSCULAR | Status: DC | PRN
  Administered 2023-11-19 (×2): 1 mg via INTRAVENOUS

## 2023-11-19 MED ORDER — ACETAMINOPHEN 10 MG/ML IV SOLN
INTRAVENOUS | Status: AC
  Filled 2023-11-19: qty 100

## 2023-11-19 MED ORDER — FENTANYL CITRATE (PF) 100 MCG/2ML IJ SOLN
25.0000 ug | INTRAMUSCULAR | Status: DC | PRN
  Administered 2023-11-19 (×3): 50 ug via INTRAVENOUS

## 2023-11-19 MED ORDER — BUPIVACAINE-EPINEPHRINE (PF) 0.25% -1:200000 IJ SOLN
INTRAMUSCULAR | Status: DC | PRN
  Administered 2023-11-19: 10 mL via PERINEURAL
  Administered 2023-11-19: 20 mL

## 2023-11-19 MED ORDER — OXYCODONE HCL 5 MG PO TABS
ORAL_TABLET | ORAL | Status: AC
  Filled 2023-11-19: qty 1

## 2023-11-19 MED ORDER — FENTANYL CITRATE (PF) 100 MCG/2ML IJ SOLN
INTRAMUSCULAR | Status: AC
  Filled 2023-11-19: qty 2

## 2023-11-19 MED ORDER — ONDANSETRON HCL 4 MG/2ML IJ SOLN
INTRAMUSCULAR | Status: AC
  Filled 2023-11-19: qty 2

## 2023-11-19 MED ORDER — SUGAMMADEX SODIUM 200 MG/2ML IV SOLN
INTRAVENOUS | Status: DC | PRN
  Administered 2023-11-19: 200 mg via INTRAVENOUS

## 2023-11-19 MED ORDER — ROCURONIUM BROMIDE 100 MG/10ML IV SOLN
INTRAVENOUS | Status: DC | PRN
  Administered 2023-11-19 (×2): 20 mg via INTRAVENOUS
  Administered 2023-11-19: 50 mg via INTRAVENOUS

## 2023-11-19 MED ORDER — HYDROCODONE-ACETAMINOPHEN 5-325 MG PO TABS: 1.0000 | ORAL_TABLET | ORAL | 0 refills | Status: AC | PRN

## 2023-11-19 MED ORDER — ACETAMINOPHEN 10 MG/ML IV SOLN: 1000.0000 mg | Freq: Once | INTRAVENOUS | Status: DC | PRN

## 2023-11-19 MED ORDER — PHENYLEPHRINE 80 MCG/ML (10ML) SYRINGE FOR IV PUSH (FOR BLOOD PRESSURE SUPPORT)
PREFILLED_SYRINGE | INTRAVENOUS | Status: AC
  Filled 2023-11-19: qty 10

## 2023-11-19 MED ORDER — ROCURONIUM BROMIDE 10 MG/ML (PF) SYRINGE
PREFILLED_SYRINGE | INTRAVENOUS | Status: AC
  Filled 2023-11-19: qty 10

## 2023-11-19 MED ORDER — DEXAMETHASONE SODIUM PHOSPHATE 10 MG/ML IJ SOLN
INTRAMUSCULAR | Status: DC | PRN
  Administered 2023-11-19: 4 mg via INTRAVENOUS

## 2023-11-19 MED ORDER — LIDOCAINE HCL (CARDIAC) PF 100 MG/5ML IV SOSY
PREFILLED_SYRINGE | INTRAVENOUS | Status: DC | PRN
  Administered 2023-11-19: 40 mg via INTRAVENOUS

## 2023-11-19 MED ORDER — PHENYLEPHRINE 80 MCG/ML (10ML) SYRINGE FOR IV PUSH (FOR BLOOD PRESSURE SUPPORT)
PREFILLED_SYRINGE | INTRAVENOUS | Status: DC | PRN
  Administered 2023-11-19 (×3): 80 ug via INTRAVENOUS

## 2023-11-19 MED ORDER — ONDANSETRON HCL 4 MG/2ML IJ SOLN
4.0000 mg | Freq: Once | INTRAMUSCULAR | Status: AC | PRN
  Administered 2023-11-19: 4 mg via INTRAVENOUS

## 2023-11-19 MED ORDER — DEXAMETHASONE SODIUM PHOSPHATE 10 MG/ML IJ SOLN
INTRAMUSCULAR | Status: AC
  Filled 2023-11-19: qty 1

## 2023-11-19 MED ORDER — LIDOCAINE HCL (PF) 2 % IJ SOLN
INTRAMUSCULAR | Status: AC
  Filled 2023-11-19: qty 5

## 2023-11-19 MED ORDER — BUPIVACAINE-EPINEPHRINE (PF) 0.25% -1:200000 IJ SOLN
INTRAMUSCULAR | Status: AC
  Filled 2023-11-19: qty 30

## 2023-11-19 MED ORDER — CHLORHEXIDINE GLUCONATE 0.12 % MT SOLN
OROMUCOSAL | Status: AC
  Filled 2023-11-19: qty 15

## 2023-11-19 MED ORDER — ACETAMINOPHEN 10 MG/ML IV SOLN
INTRAVENOUS | Status: DC | PRN
  Administered 2023-11-19: 1000 mg via INTRAVENOUS

## 2023-11-19 MED ORDER — OXYCODONE HCL 5 MG PO TABS
5.0000 mg | ORAL_TABLET | Freq: Once | ORAL | Status: AC | PRN
  Administered 2023-11-19: 5 mg via ORAL

## 2023-11-19 SURGICAL SUPPLY — 43 items
BAG PRESSURE INF REUSE 1000 (BAG) IMPLANT
COVER TIP SHEARS 8 DVNC (MISCELLANEOUS) ×1 IMPLANT
COVER WAND RF STERILE (DRAPES) ×1 IMPLANT
DERMABOND ADVANCED .7 DNX12 (GAUZE/BANDAGES/DRESSINGS) ×1 IMPLANT
DRAPE ARM DVNC X/XI (DISPOSABLE) ×3 IMPLANT
DRAPE COLUMN DVNC XI (DISPOSABLE) ×1 IMPLANT
ELECT REM PT RETURN 9FT ADLT (ELECTROSURGICAL) ×1
ELECTRODE REM PT RTRN 9FT ADLT (ELECTROSURGICAL) ×1 IMPLANT
FORCEPS BPLR R/ABLATION 8 DVNC (INSTRUMENTS) ×1 IMPLANT
GLOVE BIO SURGEON STRL SZ 6.5 (GLOVE) ×2 IMPLANT
GLOVE BIOGEL PI IND STRL 6.5 (GLOVE) ×2 IMPLANT
GOWN STRL REUS W/ TWL LRG LVL3 (GOWN DISPOSABLE) ×3 IMPLANT
IRRIGATOR SUCT 8 DISP DVNC XI (IRRIGATION / IRRIGATOR) IMPLANT
IV CATH ANGIO 12GX3 LT BLUE (NEEDLE) IMPLANT
IV NS 1000ML BAXH (IV SOLUTION) IMPLANT
KIT PINK PAD W/HEAD ARE REST (MISCELLANEOUS) ×1
KIT PINK PAD W/HEAD ARM REST (MISCELLANEOUS) ×1 IMPLANT
LABEL OR SOLS (LABEL) ×1 IMPLANT
MANIFOLD NEPTUNE II (INSTRUMENTS) ×1 IMPLANT
MESH VENTRALIGHT ST 4.5IN (Mesh General) IMPLANT
NDL DRIVE SUT CUT DVNC (INSTRUMENTS) ×1 IMPLANT
NDL HYPO 22X1.5 SAFETY MO (MISCELLANEOUS) ×1 IMPLANT
NDL INSUFFLATION 14GA 120MM (NEEDLE) ×1 IMPLANT
NEEDLE DRIVE SUT CUT DVNC (INSTRUMENTS) ×1
NEEDLE HYPO 22X1.5 SAFETY MO (MISCELLANEOUS) ×1
NEEDLE INSUFFLATION 14GA 120MM (NEEDLE) ×1
NS IRRIG 500ML POUR BTL (IV SOLUTION) ×1 IMPLANT
OBTURATOR OPTICAL STND 8 DVNC (TROCAR) ×1
OBTURATOR OPTICALSTD 8 DVNC (TROCAR) ×1 IMPLANT
PACK LAP CHOLECYSTECTOMY (MISCELLANEOUS) ×1 IMPLANT
SCISSORS MNPLR CVD DVNC XI (INSTRUMENTS) ×1 IMPLANT
SEAL UNIV 5-12 XI (MISCELLANEOUS) ×3 IMPLANT
SET TUBE SMOKE EVAC HIGH FLOW (TUBING) ×1 IMPLANT
SOL ELECTROSURG ANTI STICK (MISCELLANEOUS) ×1
SOLUTION ELECTROSURG ANTI STCK (MISCELLANEOUS) ×1 IMPLANT
SUT MNCRL 4-0 27XMFL (SUTURE) ×1
SUT STRATA 2-0 30 CT-2 (SUTURE) ×2 IMPLANT
SUT STRATAFIX PDS 30 CT-1 (SUTURE) ×1 IMPLANT
SUT VICRYL 0 UR6 27IN ABS (SUTURE) ×1 IMPLANT
SUTURE MNCRL 4-0 27XMF (SUTURE) ×1 IMPLANT
TAPE TRANSPORE STRL 2 31045 (GAUZE/BANDAGES/DRESSINGS) ×1 IMPLANT
TRAP FLUID SMOKE EVACUATOR (MISCELLANEOUS) ×1 IMPLANT
WATER STERILE IRR 500ML POUR (IV SOLUTION) ×1 IMPLANT

## 2023-11-19 NOTE — Anesthesia Procedure Notes (Signed)
Procedure Name: Intubation Date/Time: 11/19/2023 9:57 AM  Performed by: Nelle Don, CRNAPre-anesthesia Checklist: Patient identified, Emergency Drugs available, Suction available and Patient being monitored Patient Re-evaluated:Patient Re-evaluated prior to induction Oxygen Delivery Method: Circle system utilized Preoxygenation: Pre-oxygenation with 100% oxygen Induction Type: IV induction Ventilation: Oral airway inserted - appropriate to patient size and Two handed mask ventilation required Laryngoscope Size: McGrath and 4 Grade View: Grade I Tube type: Oral Tube size: 7.5 mm Number of attempts: 1 Airway Equipment and Method: Stylet and Video-laryngoscopy Placement Confirmation: ETT inserted through vocal cords under direct vision, positive ETCO2 and breath sounds checked- equal and bilateral Secured at: 22 cm Tube secured with: Tape Dental Injury: Teeth and Oropharynx as per pre-operative assessment  Comments: Elective mcgrath

## 2023-11-19 NOTE — Discharge Instructions (Signed)

## 2023-11-19 NOTE — Anesthesia Postprocedure Evaluation (Signed)
Anesthesia Post Note  Patient: Aaron [name]  Procedure(s) Performed: XI ROBOT ASSISTED UMBILICAL HERNIA REPAIR (Abdomen) INSERTION OF MESH (Abdomen)  Patient location during evaluation: PACU Anesthesia Type: General Level of consciousness: awake and alert Pain management: pain level controlled Vital Signs Assessment: post-procedure vital signs reviewed and stable Respiratory status: spontaneous breathing, nonlabored ventilation, respiratory function stable and patient connected to nasal cannula oxygen Cardiovascular status: blood pressure returned to baseline and stable Postop Assessment: no apparent nausea or vomiting Anesthetic complications: no   No notable events documented.   Last Vitals:  Vitals:   11/19/23 1215 11/19/23 1228  BP: (!) 147/77   Pulse: 83 96  Resp: 20 (!) 23  Temp:    SpO2: 93% 91%    Last Pain:  Vitals:   11/19/23 1228  TempSrc:   PainSc: 5                  Corinda Gubler

## 2023-11-19 NOTE — Transfer of Care (Signed)
Immediate Anesthesia Transfer of Care Note  Patient: Aaron [name]  Procedure(s) Performed: XI ROBOT ASSISTED UMBILICAL HERNIA REPAIR (Abdomen) INSERTION OF MESH (Abdomen)  Patient Location: PACU  Anesthesia Type:General  Level of Consciousness: drowsy  Airway & Oxygen Therapy: Patient Spontanous Breathing and Patient connected to face mask oxygen  Post-op Assessment: Report given to RN, Post -op Vital signs reviewed and stable, and Patient moving all extremities X 4  Post vital signs: Reviewed and stable  Last Vitals:  Vitals Value Taken Time  BP 144/79 11/19/23 1108  Temp    Pulse 70 11/19/23 1110  Resp 22 11/19/23 1110  SpO2 99 % 11/19/23 1110  Vitals shown include unfiled device data.  Last Pain:  Vitals:   11/19/23 1610  TempSrc: Temporal  PainSc: 0-No pain         Complications: No notable events documented.

## 2023-11-19 NOTE — Op Note (Signed)
Preoperative diagnosis: Umbilical hernia  Postoperative diagnosis: Umbilical Hernia  Procedure: Robotic assisted laparoscopic incarcerated umbilical hernia repair with mesh  Anesthesia: General  Surgeon: Carolan Shiver, MD, FACS  Wound Classification: Clean  Specimen: None  Complications: None  Estimated Blood Loss: 10 mL  Indications: A 72 year old male with symptomatic umbilical hernia. Repair indicated to improve pain and avoid complications such as incarceration or strangulation.   Findings: 3 cm incarcerated umbilical hernia 2.  Tension free repair achieved with 11.4 cm bard mesh and suture 3.  Adequate hemostasis  Description of procedure: The patient was brought to the operating room and general anesthesia was induced. A time-out was completed verifying correct patient, procedure, site, positioning, and implant(s) and/or special equipment prior to beginning this procedure. Antibiotics were administered prior to making the incision. SCDs placed. The anterior abdominal wall was prepped and draped in the standard sterile fashion.   Palmer's point chosen for entry.  Veress needle placed and abdomen insufflated to 15cm without any dramatic increase in pressure.  Needle removed and optiview technique used to place 8 mm port at same point.  No injury noted during placement. 2 additional ports were placed along left lateral aspect.  Xi robot then docked into place.  Hernia contents noted and reduced with combination of blunt, sharp dissection with scissors and fenestrated forceps.  Hemostasis achieved throughout this portion.  Once all hernia contents reduced, there was noted to be a 3 cm hernia.    Insufflation dropped to 10mm and transfacial suture with 0 stratafix used to primarily close defect under minimal tension. Bard protected 11.4 cm round mesh was placed within the abdominal cavity through the port and secured to the abdominal wall centered over the defect using the 0  stratafix previously used to primarily close defect.  The mesh was then circumferentially sutured into the anterior abdominal wall using 2-0 stratafix x2.  Any bleeding noted during this portion was no longer actively bleeding by end of securing mesh and tightening the suture.    Robot was undocked.  Abdomen then desufflated while camera within abdomen to ensure no signs of new bleed prior to removing camera and rest of ports completely.  All skin incisions closed with runninrg 4-0 Monocryl in a subcuticular fashion.  All wounds then dressed with Dermabond.  Patient was then successfully awakened and transferred to PACU in stable condition.  At the end of the procedure sponge and instrument counts were correct.

## 2023-11-19 NOTE — Interval H&P Note (Signed)
History and Physical Interval Note:  11/19/2023 9:32 AM  Aaron [name]  has presented today for surgery, with the diagnosis of K42.9 Umbilical hernia w/o obstruction or gangrene.  The various methods of treatment have been discussed with the patient and family. After consideration of risks, benefits and other options for treatment, the patient has consented to  Procedure(s): XI ROBOT ASSISTED UMBILICAL HERNIA REPAIR (N/A) as a surgical intervention.  The patient's history has been reviewed, patient examined, no change in status, stable for surgery.  I have reviewed the patient's chart and labs.  Questions were answered to the patient's satisfaction.     Carolan Shiver

## 2023-11-19 NOTE — Anesthesia Preprocedure Evaluation (Signed)
Anesthesia Evaluation  Patient identified by MRN, date of birth, ID band Patient awake    Reviewed: Allergy & Precautions, NPO status , Patient's Chart, lab work & pertinent test results  History of Anesthesia Complications Negative for: history of anesthetic complications  Airway Mallampati: II  TM Distance: >3 FB Neck ROM: Full    Dental  (+) Edentulous Upper, Edentulous Lower   Pulmonary neg pulmonary ROS, neg sleep apnea, neg COPD, Patient abstained from smoking.Not current smoker   Pulmonary exam normal breath sounds clear to auscultation       Cardiovascular Exercise Tolerance: Good METShypertension, Pt. on medications (-) CAD and (-) Past MI (-) dysrhythmias  Rhythm:Regular Rate:Normal - Systolic murmurs    Neuro/Psych negative neurological ROS  negative psych ROS   GI/Hepatic ,GERD  Medicated and Controlled,,(+)     (-) substance abuse    Endo/Other  neg diabetes    Renal/GU negative Renal ROS     Musculoskeletal   Abdominal   Peds  Hematology   Anesthesia Other Findings Past Medical History: No date: BPH (benign prostatic hyperplasia) No date: Enlarged prostate No date: GERD (gastroesophageal reflux disease) No date: Hypertension  Reproductive/Obstetrics                             Anesthesia Physical Anesthesia Plan  ASA: 2  Anesthesia Plan: General   Post-op Pain Management: Ofirmev IV (intra-op)*   Induction: Intravenous  PONV Risk Score and Plan: 3 and Ondansetron, Dexamethasone and Treatment may vary due to age or medical condition  Airway Management Planned: Oral ETT and Video Laryngoscope Planned  Additional Equipment: None  Intra-op Plan:   Post-operative Plan: Extubation in OR  Informed Consent: I have reviewed the patients History and Physical, chart, labs and discussed the procedure including the risks, benefits and alternatives for the proposed  anesthesia with the patient or authorized representative who has indicated his/her understanding and acceptance.     Dental advisory given  Plan Discussed with: CRNA and Surgeon  Anesthesia Plan Comments: (Discussed risks of anesthesia with patient, including PONV, sore throat, lip/dental/eye damage. Rare risks discussed as well, such as cardiorespiratory and neurological sequelae, and allergic reactions. Discussed the role of CRNA in patient's perioperative care. Patient understands.)       Anesthesia Quick Evaluation

## 2023-11-22 ENCOUNTER — Encounter: Payer: Self-pay | Admitting: General Surgery

## 2024-03-13 ENCOUNTER — Ambulatory Visit: Admitting: Urology

## 2024-04-19 ENCOUNTER — Ambulatory Visit: Admitting: Urology

## 2024-05-17 ENCOUNTER — Ambulatory Visit: Admitting: Urology

## 2024-06-30 ENCOUNTER — Encounter: Payer: Self-pay | Admitting: Urology

## 2024-06-30 ENCOUNTER — Ambulatory Visit: Admitting: Urology

## 2024-06-30 VITALS — BP 156/90 | HR 87 | Ht 65.0 in | Wt 185.0 lb

## 2024-06-30 DIAGNOSIS — N400 Enlarged prostate without lower urinary tract symptoms: Secondary | ICD-10-CM | POA: Diagnosis not present

## 2024-06-30 DIAGNOSIS — E291 Testicular hypofunction: Secondary | ICD-10-CM | POA: Diagnosis not present

## 2024-06-30 DIAGNOSIS — R972 Elevated prostate specific antigen [PSA]: Secondary | ICD-10-CM

## 2024-06-30 NOTE — Progress Notes (Signed)
 I, Maysun LITTIE Griffiths, acting as a scribe for Glendia JAYSON Barba, MD., have documented all relevant documentation on the behalf of Glendia JAYSON Barba, MD, as directed by Glendia JAYSON Barba, MD while in the presence of Glendia JAYSON Barba, MD.  06/30/2024 3:02 PM   [name], Aaron [name] [id number]  Referring provider: Jeffie Cheryl BRAVO, MD [address] [place],  [place] [address]  Chief Complaint  Patient presents with   Hypogonadism    HPI: Aaron [name] is a 73 y.o. male presents to re-establish local urologic care.   Previously followed by Dr. Kassie for elevated PSA, BPH, and hypogonadism. Long history of an elevated PSA. He underwent TRUS/biopsy of the prostate by Dr. Kassie in 2016 for a PSA of 14.2, 2017 for a PSA 10.0, and 2021 for a PSA of 12.2 His last prostate MRI in 2021 remarkable for a 77cc prostate, and no lesions suspicious for high-grade prostate cancer. His last PSA was in February 2022 and was 16.2 History of BPH and underwent greenlight PVP by Dr. Kassie in 2015. He has no bothersome lower urinary tract symptoms.  He was on Androgel  1.62% for hypogonadism. He is no longer on TRT and has not had a recent testosterone  level. He does have some tiredness and fatigue, but attributes this to his age.  PSA trend   Prostate Specific Ag, Serum  Latest Ref Rng 0.0 - 4.0 ng/mL  08/02/2019 10.1 (H)   01/15/2021 16.2 (H)     PMH: Past Medical History:  Diagnosis Date   BPH (benign prostatic hyperplasia)    Enlarged prostate    GERD (gastroesophageal reflux disease)    Hypertension     Surgical History: Past Surgical History:  Procedure Laterality Date   COLONOSCOPY     INSERTION OF MESH N/A 11/19/2023   Procedure: INSERTION OF MESH;  Surgeon: Rodolph Romano, MD;  Location: [facility] ORS;  Service: General;  Laterality: N/A;  umbilical hernia   TONSILLECTOMY     age 65    Home Medications:  Allergies as of 06/30/2024   No Known Allergies      Medication List         Accurate as of June 30, 2024  3:02 PM. If you have any questions, ask your nurse or doctor.          amLODipine 10 MG tablet Commonly known as: NORVASC Take 1 tablet by mouth daily.   fluticasone  50 MCG/ACT nasal spray Commonly known as: FLONASE  Place 2 sprays into both nostrils daily as needed for allergies.   losartan 100 MG tablet Commonly known as: COZAAR Take 100 mg by mouth daily.   metoprolol succinate 25 MG [name] tablet Commonly known as: TOPROL-XL Take 25 mg by mouth daily.   multivitamin with minerals tablet Take 1 tablet by mouth daily.   omeprazole 20 MG capsule Commonly known as: PRILOSEC Take 20 mg by mouth daily.   rosuvastatin 5 MG tablet Commonly known as: CRESTOR Take 5 mg by mouth daily.   sildenafil  20 MG tablet Commonly known as: REVATIO  Take 20 mg by mouth daily as needed (ED).        Allergies: No Known Allergies  Family History: Family History  Problem Relation Age of Onset   Diabetes Paternal Grandmother     Social History:  reports that he has never smoked. He has never used smokeless tobacco. He reports current alcohol use. He reports that he does not use drugs.   Physical  Exam: BP (!) 156/90 (BP Location: Left Arm, Patient Position: Sitting, Cuff Size: Normal)   Pulse 87   Ht 5' 5 (1.651 m)   Wt 185 lb (83.9 kg)   BMI 30.79 kg/m   Constitutional:  Alert and oriented, No acute distress. HEENT: [place] AT Respiratory: Normal respiratory effort, no increased work of breathing. GU: Prostate 50+ cc. Asymmetry L>R, however no nodules or induration appreciated.  Psychiatric: Normal mood and affect.   Assessment & Plan:    1. Elevated PSA Prior negative biopsies x3 and negative MRI. Benign DRE Last PSA was in 2022 and PSA drawn today. He will be notified with results and further recommendations.   2. BPH without LUTS Underwent GreenLight PVP in 2015.  3. Hypogonadism Presently off TRT Testosterone  level drawn today.  I  have reviewed the above documentation for accuracy and completeness, and I agree with the above.   Glendia JAYSON Barba, MD  [facility] [address] [place], [place] [address] [phone]

## 2024-07-01 ENCOUNTER — Ambulatory Visit: Payer: Self-pay | Admitting: Urology

## 2024-07-01 DIAGNOSIS — R972 Elevated prostate specific antigen [PSA]: Secondary | ICD-10-CM

## 2024-07-01 LAB — PSA: Prostate Specific Ag, Serum: 21.6 ng/mL — ABNORMAL HIGH (ref 0.0–4.0)

## 2024-07-01 LAB — TESTOSTERONE: Testosterone: 914 ng/dL (ref 264–916)

## 2024-07-03 NOTE — Telephone Encounter (Signed)
-----   Message from Glendia BROCKS [facility] sent at 07/01/2024  5:11 PM EDT ----- Patient has not checked his MyChart since 2024.  Please let him know PSA has increased to 21.6.  Last MRI was performed in 2021 for a PSA of 12.2.  Recommend scheduling a repeat MRI. ----- Message ----- From: Interface, [facility] Lab Results In Sent: 07/01/2024   5:36 AM EDT To: Glendia BROCKS Barba, MD

## 2024-07-03 NOTE — Telephone Encounter (Signed)
 Patient aware of results and recommendations.  MRI ordered.

## 2024-07-19 NOTE — Addendum Note (Signed)
 Addended by: GENITA HARLENE CROME on: 07/19/2024 10:49 AM   Modules accepted: Orders

## 2024-07-26 ENCOUNTER — Ambulatory Visit
Admission: RE | Admit: 2024-07-26 | Discharge: 2024-07-26 | Disposition: A | Discharge status: Home / Self Care | Source: Ambulatory Visit | Attending: Urology | Admitting: Urology

## 2024-07-26 DIAGNOSIS — R972 Elevated prostate specific antigen [PSA]: Secondary | ICD-10-CM | POA: Diagnosis present

## 2024-07-26 MED ORDER — GADOBUTROL 1 MMOL/ML IV SOLN
8.0000 mL | Freq: Once | INTRAVENOUS | Status: AC | PRN
  Administered 2024-07-26: 8 mL via INTRAVENOUS

## 2024-08-05 ENCOUNTER — Ambulatory Visit: Payer: Self-pay | Admitting: Urology

## 2025-02-05 ENCOUNTER — Other Ambulatory Visit

## 2025-02-09 ENCOUNTER — Ambulatory Visit: Admitting: Urology
